# Patient Record
Sex: Female | Born: 1960
Health system: Southern US, Community
[De-identification: ages and names within clinical notes are randomized; demographics above are authoritative.]

## PROBLEM LIST (undated history)

## (undated) DIAGNOSIS — I82409 Acute embolism and thrombosis of unspecified deep veins of unspecified lower extremity: Secondary | ICD-10-CM

## (undated) DIAGNOSIS — B029 Zoster without complications: Secondary | ICD-10-CM

## (undated) DIAGNOSIS — R748 Abnormal levels of other serum enzymes: Secondary | ICD-10-CM

## (undated) DIAGNOSIS — F329 Major depressive disorder, single episode, unspecified: Secondary | ICD-10-CM

## (undated) DIAGNOSIS — I10 Essential (primary) hypertension: Secondary | ICD-10-CM

## (undated) HISTORY — PX: TONSILLECTOMY: SUR1361

## (undated) HISTORY — DX: Zoster without complications: B02.9

## (undated) HISTORY — DX: Essential (primary) hypertension: I10

## (undated) HISTORY — DX: Abnormal levels of other serum enzymes: R74.8

## (undated) HISTORY — PX: DILATION AND CURETTAGE OF UTERUS: SHX78

## (undated) HISTORY — DX: Major depressive disorder, single episode, unspecified: F32.9

## (undated) HISTORY — DX: Acute embolism and thrombosis of unspecified deep veins of unspecified lower extremity: I82.409

---

## 2012-09-18 ENCOUNTER — Ambulatory Visit: Payer: Self-pay

## 2013-04-25 LAB — HM COLONOSCOPY

## 2013-12-10 LAB — HM PAP SMEAR

## 2014-01-14 ENCOUNTER — Ambulatory Visit: Payer: Self-pay

## 2014-01-14 LAB — HM MAMMOGRAPHY

## 2014-12-11 DIAGNOSIS — F329 Major depressive disorder, single episode, unspecified: Secondary | ICD-10-CM

## 2014-12-11 DIAGNOSIS — F32A Depression, unspecified: Secondary | ICD-10-CM

## 2014-12-11 DIAGNOSIS — I10 Essential (primary) hypertension: Secondary | ICD-10-CM | POA: Insufficient documentation

## 2014-12-11 DIAGNOSIS — R748 Abnormal levels of other serum enzymes: Secondary | ICD-10-CM | POA: Insufficient documentation

## 2014-12-11 HISTORY — DX: Depression, unspecified: F32.A

## 2014-12-12 ENCOUNTER — Ambulatory Visit (INDEPENDENT_AMBULATORY_CARE_PROVIDER_SITE_OTHER): Payer: BLUE CROSS/BLUE SHIELD | Admitting: Unknown Physician Specialty

## 2014-12-12 ENCOUNTER — Encounter: Payer: Self-pay | Admitting: Unknown Physician Specialty

## 2014-12-12 VITALS — BP 132/75 | HR 78 | Temp 98.5°F | Ht 66.0 in | Wt 197.2 lb

## 2014-12-12 DIAGNOSIS — E669 Obesity, unspecified: Secondary | ICD-10-CM | POA: Diagnosis not present

## 2014-12-12 DIAGNOSIS — Z0000 Encounter for general adult medical examination without abnormal findings: Secondary | ICD-10-CM | POA: Diagnosis not present

## 2014-12-12 DIAGNOSIS — M7581 Other shoulder lesions, right shoulder: Secondary | ICD-10-CM | POA: Diagnosis not present

## 2014-12-12 NOTE — Progress Notes (Signed)
BP 132/75 mmHg  Pulse 78  Temp(Src) 98.5 F (36.9 C)  Ht 5\' 6"  (4.431 m)  Wt 197 lb 3.2 oz (89.449 kg)  BMI 31.84 kg/m2  SpO2 99%  LMP  (LMP Unknown)   Subjective:    Patient ID: Maria Ball, female    DOB: 06-Jun-1960, 54 y.o.   MRN: [id number]  HPI: Maria Ball is a 54 y.o. female  Chief Complaint  Patient presents with  . Annual Exam    Relevant past medical, surgical, family and social history reviewed and updated as indicated. Interim medical history since our last visit reviewed. Allergies and medications reviewed and updated.  Review of Systems  Constitutional: Negative.   HENT: Negative.   Eyes: Negative.   Respiratory: Negative.   Cardiovascular: Negative.   Gastrointestinal: Negative.   Endocrine: Negative.   Genitourinary: Negative.   Musculoskeletal:       Right upper arm hurts of reaching up or putting arm behind her.  Problem comes and goes for about 2-3 months.    Skin: Negative.   Allergic/Immunologic: Negative.   Neurological: Negative.   Hematological: Negative.   Psychiatric/Behavioral: Negative.       Objective:    BP 132/75 mmHg  Pulse 78  Temp(Src) 98.5 F (36.9 C)  Ht 5\' 6"  (1.676 m)  Wt 197 lb 3.2 oz (89.449 kg)  BMI 31.84 kg/m2  SpO2 99%  LMP  (LMP Unknown)  Wt Readings from Last 3 Encounters:  12/12/14 197 lb 3.2 oz (89.449 kg)  12/10/13 200 lb (90.719 kg)    Physical Exam  Constitutional: She is oriented to person, place, and time. She appears well-developed and well-nourished.  HENT:  Head: Normocephalic and atraumatic.  Eyes: Pupils are equal, round, and reactive to light. Right eye exhibits no discharge. Left eye exhibits no discharge. No scleral icterus.  Neck: Normal range of motion. Neck supple. Carotid bruit is not present. No thyromegaly present.  Cardiovascular: Normal rate, regular rhythm and normal heart sounds.  Exam reveals no gallop and no friction rub.   No murmur heard. Pulmonary/Chest:  Effort normal and breath sounds normal. No respiratory distress. She has no wheezes. She has no rales.  Abdominal: Soft. Bowel sounds are normal. There is no tenderness. There is no rebound.  Genitourinary: No breast swelling, tenderness or discharge.  Musculoskeletal: Normal range of motion.       Right shoulder: She exhibits no tenderness, no bony tenderness, no swelling, no effusion, no crepitus and no deformity.  Right shoulder positive empty can.  Positive impingment with cross arm, Obrien, and Neer test.    Lymphadenopathy:    She has no cervical adenopathy.  Neurological: She is alert and oriented to person, place, and time.  Skin: Skin is warm, dry and intact. No rash noted.  Psychiatric: She has a normal mood and affect. Her speech is normal and behavior is normal. Judgment and thought content normal. Cognition and memory are normal.      Assessment & Plan:   Problem List Items Addressed This Visit      Unprioritized   Obesity    Lost 15 pounds in the last year.  Working on diet and exercise.         Other Visit Diagnoses    Rotator cuff tendonitis, right    -  Primary    Refer to PT for treatment.      Relevant Orders    Ambulatory referral to Physical Therapy    Annual  physical exam        Relevant Orders    MM DIGITAL SCREENING BILATERAL    CBC with Differential/Platelet    Comprehensive metabolic panel    TSH    Lipid Panel w/o Chol/HDL Ratio    HIV antibody         Follow up plan: Return in about 1 year (around 12/12/2015), or if symptoms worsen or fail to improve.

## 2014-12-12 NOTE — Patient Instructions (Signed)
Think you're too busy to work out? We have the workout for you. In minutes, high-intensity interval training (H.I.I.T.) will have you sweating, breathing hard and maximizing the health benefits of exercise without the time commitment. Best of all, it's scientifically proven to work.  What Is H.I.I.T.? SHORT WORKOUTS 101 High-intensity interval training - referred to as H.I.I.T. - is based on the idea that short bursts of strenuous exercise can have a big impact on the body. If moderate exercise - like a 20-minute jog - is good for your heart, lungs and metabolism, H.I.I.T. packs the benefits of that workout and more into a few minutes. It may sound too good to be true, but learning this exercise technique and adapting it to your life can mean saving hours at the gym. If you think you don't have time to exercise, H.I.I.T. may be the workout for you.  You can try it with any aerobic activity you like. The principles of H.I.I.T. can be applied to running, biking, stair climbing, swimming, jumping rope, rowing, even hopping or skipping. (Yes, skipping!)  The downside? Even though H.I.I.T. lasts only minutes, the workouts are tough, requiring you to push your body near its limit.  HOW INTENSE IS HIGH INTENSITY? High-intensity exercise is obviously not a casual stroll down the street, but it's not a run-till-your-lungs-pop explosion, either. Think breathless, not winded. Heart-pounding, not exploding. Legs pumping, but not uncontrolled.  You don't need any fancy heart rate monitors to do these workouts. Use cues from your body as a guide. In the middle of a high-intensity workout you should be able to say single words, but not complete whole sentences. So, if you can keep chatting to your workout partner during this workout, pump it up a few notches.  02-25-29 Training This simple program will help you make the most of a short workout by improving heart health and endurance. Try it with your favorite  cardiovascular activity. The essentials of 02-25-29 training are simple. Run, ride or perhaps row on a rowing machine gently for 30 seconds, accelerate to a moderate pace for 20 seconds, then sprint as hard as you can for 10 seconds. (It should be called 30-20-10 training, obviously, but that is not as catchy.) Repeat.  You don't even need a stopwatch to monitor the 30-, 20-, and 10-second time changes. You can just count to yourself, which seems to make the intervals pass more quickly.  Best of all? The grueling, all-out portion of the workout lasts for only 10 seconds. C'mon, you can do anything for 10 seconds, right?  Got 10 Minutes? A solitary minute of hard work buried in 10 minutes of activity can make a big difference.  The 10-Minute Workout If you like to run, bike, row or swim - just a little bit - this workout is a great option for you. Step 1 Warm up for 2 minutes Step 2 Pedal, run or swim all-out for 20 seconds. Repeat 2 more times Warm down for 3 minutes    GET STARTED To benefit the most from really, really short workouts, you need to build the habit of doing them into your hectic life. Ideally, you'll complete the workout three times a week. The best way to build that habit is to start small and be willing to tweak your schedule where you can to accommodate your new workout.  First set up a spot in your house for your workout, equipped with whatever you need to get the job done: sneakers, a  chair, a towel, etc. Then slot your workout in before you would normally shower. (You can even do it in the bathroom.) Or wake up five minutes earlier and do it first thing in the morning, so you can head off to work feeling accomplished. Or do it during your lunch hour. Run up your office's stairs or grab a private conference room for just a few minutes. Or work it into your commute. If you walk or bike to work, add some heavy intervals on the way home.  GET A BOOST FROM MUSIC Creating a  workout playlist of high-energy tunes you love will not make your workout feel easier, but it may cause you to exercise harder without even realizing it. Best of all, if you are doing a really short workout, you need only one or two great tunes to get you through. If you are willing to try something a bit different, make your own music as you exercise. Sing, hum, clap your hands, whatever you can do to jam along to your playlist. It may give you an extra boost to finish strong.  Find a song or podcast that's the length of your really, really short workout. By the time the song is over, you're done.  Excerpted from the [employer] Well column http://www.nytimes.com/well/guides/really-really-short-workouts?smid=fb-nytwell&smtyp=pay  

## 2014-12-12 NOTE — Assessment & Plan Note (Signed)
Lost 15 pounds in the last year.  Working on diet and exercise.

## 2014-12-13 LAB — CBC WITH DIFFERENTIAL/PLATELET
BASOS: 1 %
Basophils Absolute: 0 10*3/uL (ref 0.0–0.2)
EOS (ABSOLUTE): 0 10*3/uL (ref 0.0–0.4)
Eos: 1 %
HEMOGLOBIN: 13.2 g/dL (ref 11.1–15.9)
Hematocrit: 39.5 % (ref 34.0–46.6)
IMMATURE GRANULOCYTES: 0 %
Immature Grans (Abs): 0 10*3/uL (ref 0.0–0.1)
LYMPHS ABS: 2 10*3/uL (ref 0.7–3.1)
Lymphs: 34 %
MCH: 27 pg (ref 26.6–33.0)
MCHC: 33.4 g/dL (ref 31.5–35.7)
MCV: 81 fL (ref 79–97)
Monocytes Absolute: 0.3 10*3/uL (ref 0.1–0.9)
Monocytes: 5 %
Neutrophils Absolute: 3.4 10*3/uL (ref 1.4–7.0)
Neutrophils: 59 %
Platelets: 344 10*3/uL (ref 150–379)
RBC: 4.89 x10E6/uL (ref 3.77–5.28)
RDW: 14.2 % (ref 12.3–15.4)
WBC: 5.8 10*3/uL (ref 3.4–10.8)

## 2014-12-13 LAB — LIPID PANEL W/O CHOL/HDL RATIO
CHOLESTEROL TOTAL: 195 mg/dL (ref 100–199)
HDL: 43 mg/dL (ref >39)
LDL Calculated: 128 mg/dL — ABNORMAL HIGH (ref 0–99)
TRIGLYCERIDES: 120 mg/dL (ref 0–149)
VLDL Cholesterol Cal: 24 mg/dL (ref 5–40)

## 2014-12-13 LAB — COMPREHENSIVE METABOLIC PANEL
ALK PHOS: 141 IU/L — AB (ref 39–117)
ALT: 20 IU/L (ref 0–32)
AST: 15 IU/L (ref 0–40)
Albumin/Globulin Ratio: 1.9 (ref 1.1–2.5)
Albumin: 4.2 g/dL (ref 3.5–5.5)
BILIRUBIN TOTAL: 0.2 mg/dL (ref 0.0–1.2)
BUN/Creatinine Ratio: 16 (ref 9–23)
BUN: 9 mg/dL (ref 6–24)
CO2: 22 mmol/L (ref 18–29)
Calcium: 9.1 mg/dL (ref 8.7–10.2)
Chloride: 105 mmol/L (ref 97–108)
Creatinine, Ser: 0.58 mg/dL (ref 0.57–1.00)
GFR calc Af Amer: 121 mL/min/{1.73_m2} (ref >59)
GFR calc non Af Amer: 105 mL/min/{1.73_m2} (ref >59)
Globulin, Total: 2.2 g/dL (ref 1.5–4.5)
Glucose: 93 mg/dL (ref 65–99)
Potassium: 4.2 mmol/L (ref 3.5–5.2)
Sodium: 144 mmol/L (ref 134–144)
TOTAL PROTEIN: 6.4 g/dL (ref 6.0–8.5)

## 2014-12-13 LAB — TSH: TSH: 1.1 u[IU]/mL (ref 0.450–4.500)

## 2014-12-13 LAB — HIV ANTIBODY (ROUTINE TESTING W REFLEX): HIV SCREEN 4TH GENERATION: NONREACTIVE

## 2014-12-15 ENCOUNTER — Encounter: Payer: Self-pay | Admitting: Unknown Physician Specialty

## 2015-01-16 ENCOUNTER — Ambulatory Visit
Admission: RE | Admit: 2015-01-16 | Discharge: 2015-01-16 | Disposition: A | Discharge status: Home / Self Care | Payer: BLUE CROSS/BLUE SHIELD | Source: Ambulatory Visit | Attending: Unknown Physician Specialty | Admitting: Unknown Physician Specialty

## 2015-01-16 DIAGNOSIS — Z1231 Encounter for screening mammogram for malignant neoplasm of breast: Secondary | ICD-10-CM | POA: Diagnosis not present

## 2015-01-16 DIAGNOSIS — Z0000 Encounter for general adult medical examination without abnormal findings: Secondary | ICD-10-CM

## 2015-12-15 ENCOUNTER — Encounter: Payer: BLUE CROSS/BLUE SHIELD | Admitting: Unknown Physician Specialty

## 2016-02-29 ENCOUNTER — Ambulatory Visit (INDEPENDENT_AMBULATORY_CARE_PROVIDER_SITE_OTHER): Payer: BLUE CROSS/BLUE SHIELD | Admitting: Unknown Physician Specialty

## 2016-02-29 ENCOUNTER — Encounter: Payer: Self-pay | Admitting: Unknown Physician Specialty

## 2016-02-29 VITALS — BP 130/77 | HR 89 | Temp 98.3°F | Ht 66.0 in | Wt 203.2 lb

## 2016-02-29 DIAGNOSIS — Z0000 Encounter for general adult medical examination without abnormal findings: Secondary | ICD-10-CM

## 2016-02-29 DIAGNOSIS — Z23 Encounter for immunization: Secondary | ICD-10-CM | POA: Diagnosis not present

## 2016-02-29 DIAGNOSIS — R748 Abnormal levels of other serum enzymes: Secondary | ICD-10-CM | POA: Diagnosis not present

## 2016-02-29 NOTE — Progress Notes (Signed)
BP 130/77 (BP Location: Left Arm, Cuff Size: Large)   Pulse 89   Temp 98.3 F (36.8 C)   Ht 5\' 6"  (1.676 m)   Wt 203 lb 3.2 oz (92.2 kg)   LMP  (LMP Unknown)   SpO2 98%   BMI 32.80 kg/m    Subjective:    Patient ID: Maria Ball, female    DOB: 05-Mar-1961, 55 y.o.   MRN: [id number]  HPI: Maria Ball is a 54 y.o. female  Chief Complaint  Patient presents with  . Annual Exam    Hep C order entered     Relevant past medical, surgical, family and social history reviewed and updated as indicated. Interim medical history since our last visit reviewed. Allergies and medications reviewed and updated.  Review of Systems  Constitutional: Negative.   HENT: Negative.   Eyes: Negative.   Respiratory: Negative.   Cardiovascular: Negative.   Gastrointestinal: Negative.   Endocrine: Negative.   Genitourinary: Negative.   Musculoskeletal: Negative.   Skin: Negative.   Allergic/Immunologic: Negative.   Neurological: Negative.   Hematological: Negative.   Psychiatric/Behavioral: Negative.     Per HPI unless specifically indicated above     Objective:    BP 130/77 (BP Location: Left Arm, Cuff Size: Large)   Pulse 89   Temp 98.3 F (36.8 C)   Ht 5\' 6"  (1.676 m)   Wt 203 lb 3.2 oz (92.2 kg)   LMP  (LMP Unknown)   SpO2 98%   BMI 32.80 kg/m   Wt Readings from Last 3 Encounters:  02/29/16 203 lb 3.2 oz (92.2 kg)  12/12/14 197 lb 3.2 oz (89.4 kg)  12/10/13 200 lb (90.7 kg)    Physical Exam  Constitutional: She is oriented to person, place, and time. She appears well-developed and well-nourished.  HENT:  Head: Normocephalic and atraumatic.  Eyes: Pupils are equal, round, and reactive to light. Right eye exhibits no discharge. Left eye exhibits no discharge. No scleral icterus.  Neck: Normal range of motion. Neck supple. Carotid bruit is not present. No thyromegaly present.  Cardiovascular: Normal rate, regular rhythm and normal heart sounds.  Exam reveals  no gallop and no friction rub.   No murmur heard. Pulmonary/Chest: Effort normal and breath sounds normal. No respiratory distress. She has no wheezes. She has no rales.  Abdominal: Soft. Bowel sounds are normal. There is no tenderness. There is no rebound.  Genitourinary: No breast swelling, tenderness or discharge.  Musculoskeletal: Normal range of motion.  Lymphadenopathy:    She has no cervical adenopathy.  Neurological: She is alert and oriented to person, place, and time.  Skin: Skin is warm, dry and intact. No rash noted.  Psychiatric: She has a normal mood and affect. Her speech is normal and behavior is normal. Judgment and thought content normal. Cognition and memory are normal.    Results for orders placed or performed in visit on 12/12/14  CBC with Differential/Platelet  Result Value Ref Range   WBC 5.8 3.4 - 10.8 x10E3/uL   RBC 4.89 3.77 - 5.28 x10E6/uL   Hemoglobin 13.2 11.1 - 15.9 g/dL   Hematocrit 39.5 34.0 - 46.6 %   MCV 81 79 - 97 fL   MCH 27.0 26.6 - 33.0 pg   MCHC 33.4 31.5 - 35.7 g/dL   RDW 14.2 12.3 - 15.4 %   Platelets 344 150 - 379 x10E3/uL   Neutrophils 59 %   Lymphs 34 %   Monocytes 5 %  Eos 1 %   Basos 1 %   Neutrophils Absolute 3.4 1.4 - 7.0 x10E3/uL   Lymphocytes Absolute 2.0 0.7 - 3.1 x10E3/uL   Monocytes Absolute 0.3 0.1 - 0.9 x10E3/uL   EOS (ABSOLUTE) 0.0 0.0 - 0.4 x10E3/uL   Basophils Absolute 0.0 0.0 - 0.2 x10E3/uL   Immature Granulocytes 0 %   Immature Grans (Abs) 0.0 0.0 - 0.1 x10E3/uL  Comprehensive metabolic panel  Result Value Ref Range   Glucose 93 65 - 99 mg/dL   BUN 9 6 - 24 mg/dL   Creatinine, Ser 0.58 0.57 - 1.00 mg/dL   GFR calc non Af Amer 105 >59 mL/min/1.73   GFR calc Af Amer 121 >59 mL/min/1.73   BUN/Creatinine Ratio 16 9 - 23   Sodium 144 134 - 144 mmol/L   Potassium 4.2 3.5 - 5.2 mmol/L   Chloride 105 97 - 108 mmol/L   CO2 22 18 - 29 mmol/L   Calcium 9.1 8.7 - 10.2 mg/dL   Total Protein 6.4 6.0 - 8.5 g/dL   Albumin  4.2 3.5 - 5.5 g/dL   Globulin, Total 2.2 1.5 - 4.5 g/dL   Albumin/Globulin Ratio 1.9 1.1 - 2.5   Bilirubin Total 0.2 0.0 - 1.2 mg/dL   Alkaline Phosphatase 141 (H) 39 - 117 IU/L   AST 15 0 - 40 IU/L   ALT 20 0 - 32 IU/L  TSH  Result Value Ref Range   TSH 1.100 0.450 - 4.500 uIU/mL  Lipid Panel w/o Chol/HDL Ratio  Result Value Ref Range   Cholesterol, Total 195 100 - 199 mg/dL   Triglycerides 120 0 - 149 mg/dL   HDL 43 >39 mg/dL   VLDL Cholesterol Cal 24 5 - 40 mg/dL   LDL Calculated 128 (H) 0 - 99 mg/dL  HIV antibody  Result Value Ref Range   HIV Screen 4th Generation wRfx Non Reactive Non Reactive      Assessment & Plan:   Problem List Items Addressed This Visit      Unprioritized   Alkaline phosphatase raised   Relevant Orders   Comprehensive metabolic panel    Other Visit Diagnoses    Health care maintenance    -  Primary   Need for influenza vaccination       Relevant Orders   Flu Vaccine QUAD 36+ mos IM (Completed)   Annual physical exam       Relevant Orders   CBC with Differential/Platelet   Comprehensive metabolic panel   Lipid Panel w/o Chol/HDL Ratio   TSH   MM DIGITAL SCREENING BILATERAL   Hepatitis C antibody       Follow up plan: Return for f/u mole removal.

## 2016-02-29 NOTE — Patient Instructions (Addendum)

## 2016-03-01 ENCOUNTER — Encounter: Payer: Self-pay | Admitting: Unknown Physician Specialty

## 2016-03-01 ENCOUNTER — Ambulatory Visit (INDEPENDENT_AMBULATORY_CARE_PROVIDER_SITE_OTHER): Payer: BLUE CROSS/BLUE SHIELD | Admitting: Unknown Physician Specialty

## 2016-03-01 VITALS — BP 138/72 | HR 101 | Temp 98.9°F | Ht 66.0 in | Wt 202.0 lb

## 2016-03-01 DIAGNOSIS — H6121 Impacted cerumen, right ear: Secondary | ICD-10-CM | POA: Diagnosis not present

## 2016-03-01 DIAGNOSIS — L821 Other seborrheic keratosis: Secondary | ICD-10-CM

## 2016-03-01 LAB — CBC WITH DIFFERENTIAL/PLATELET
BASOS ABS: 0 10*3/uL (ref 0.0–0.2)
BASOS: 0 %
EOS (ABSOLUTE): 0 10*3/uL (ref 0.0–0.4)
Eos: 1 %
Hematocrit: 40 % (ref 34.0–46.6)
Hemoglobin: 13.2 g/dL (ref 11.1–15.9)
IMMATURE GRANS (ABS): 0 10*3/uL (ref 0.0–0.1)
IMMATURE GRANULOCYTES: 0 %
LYMPHS: 39 %
Lymphocytes Absolute: 3 10*3/uL (ref 0.7–3.1)
MCH: 27.5 pg (ref 26.6–33.0)
MCHC: 33 g/dL (ref 31.5–35.7)
MCV: 83 fL (ref 79–97)
MONOS ABS: 0.4 10*3/uL (ref 0.1–0.9)
Monocytes: 6 %
NEUTROS PCT: 54 %
Neutrophils Absolute: 4.2 10*3/uL (ref 1.4–7.0)
PLATELETS: 375 10*3/uL (ref 150–379)
RBC: 4.8 x10E6/uL (ref 3.77–5.28)
RDW: 14 % (ref 12.3–15.4)
WBC: 7.7 10*3/uL (ref 3.4–10.8)

## 2016-03-01 LAB — LIPID PANEL W/O CHOL/HDL RATIO
CHOLESTEROL TOTAL: 195 mg/dL (ref 100–199)
HDL: 43 mg/dL (ref >39)
LDL Calculated: 115 mg/dL — ABNORMAL HIGH (ref 0–99)
TRIGLYCERIDES: 183 mg/dL — AB (ref 0–149)
VLDL CHOLESTEROL CAL: 37 mg/dL (ref 5–40)

## 2016-03-01 LAB — COMPREHENSIVE METABOLIC PANEL
A/G RATIO: 1.7 (ref 1.2–2.2)
ALT: 26 IU/L (ref 0–32)
AST: 18 IU/L (ref 0–40)
Albumin: 4.2 g/dL (ref 3.5–5.5)
Alkaline Phosphatase: 161 IU/L — ABNORMAL HIGH (ref 39–117)
BUN/Creatinine Ratio: 11 (ref 9–23)
BUN: 8 mg/dL (ref 6–24)
CHLORIDE: 104 mmol/L (ref 96–106)
CO2: 24 mmol/L (ref 18–29)
Calcium: 9.3 mg/dL (ref 8.7–10.2)
Creatinine, Ser: 0.76 mg/dL (ref 0.57–1.00)
GFR calc Af Amer: 101 mL/min/{1.73_m2} (ref >59)
GFR calc non Af Amer: 88 mL/min/{1.73_m2} (ref >59)
GLUCOSE: 95 mg/dL (ref 65–99)
Globulin, Total: 2.5 g/dL (ref 1.5–4.5)
POTASSIUM: 4.3 mmol/L (ref 3.5–5.2)
Sodium: 142 mmol/L (ref 134–144)
TOTAL PROTEIN: 6.7 g/dL (ref 6.0–8.5)

## 2016-03-01 LAB — TSH: TSH: 1.03 u[IU]/mL (ref 0.450–4.500)

## 2016-03-01 LAB — HEPATITIS C ANTIBODY: Hep C Virus Ab: <0.1 s/co ratio (ref 0.0–0.9)

## 2016-03-01 NOTE — Progress Notes (Signed)
BP 138/72 (BP Location: Left Arm, Patient Position: Sitting, Cuff Size: Large)   Pulse (!) 101   Temp 98.9 F (37.2 C)   Ht 5\' 6"  (1.676 m)   Wt 202 lb (91.6 kg)   LMP  (LMP Unknown)   SpO2 97%   BMI 32.60 kg/m    Subjective:    Patient ID: Maria Ball, female    DOB: [birth year], 55 y.o.   MRN: [id number]  HPI: Maria Ball is a 55 y.o. female  Chief Complaint  Patient presents with  . Mole Removal   Pt is here to f/u with a irregular mole on neck with ABCD changes.    Having fullness right ear.    Relevant past medical, surgical, family and social history reviewed and updated as indicated. Interim medical history since our last visit reviewed. Allergies and medications reviewed and updated.  Review of Systems  Per HPI unless specifically indicated above     Objective:    BP 138/72 (BP Location: Left Arm, Patient Position: Sitting, Cuff Size: Large)   Pulse (!) 101   Temp 98.9 F (37.2 C)   Ht 5\' 6"  (1.676 m)   Wt 202 lb (91.6 kg)   LMP  (LMP Unknown)   SpO2 97%   BMI 32.60 kg/m   Wt Readings from Last 3 Encounters:  03/01/16 202 lb (91.6 kg)  02/29/16 203 lb 3.2 oz (92.2 kg)  12/12/14 197 lb 3.2 oz (89.4 kg)    Physical Exam  Constitutional: She is oriented to person, place, and time. She appears well-developed and well-nourished. No distress.  HENT:  Head: Normocephalic and atraumatic.  Cerumen right ear.  Ear irrigated  Eyes: Conjunctivae and lids are normal. Right eye exhibits no discharge. Left eye exhibits no discharge. No scleral icterus.  Cardiovascular: Normal rate.   Pulmonary/Chest: Effort normal.  Abdominal: Normal appearance. There is no splenomegaly or hepatomegaly.  Musculoskeletal: Normal range of motion.  Neurological: She is alert and oriented to person, place, and time.  Skin: Skin is intact. No rash noted. No pallor.  Irregular lesion on neck suspect seborrheic keratosis.  Area cryoed  Psychiatric: She has a normal  mood and affect. Her behavior is normal. Judgment and thought content normal.    Results for orders placed or performed in visit on 02/29/16  CBC with Differential/Platelet  Result Value Ref Range   WBC 7.7 3.4 - 10.8 x10E3/uL   RBC 4.80 3.77 - 5.28 x10E6/uL   Hemoglobin 13.2 11.1 - 15.9 g/dL   Hematocrit 40.0 34.0 - 46.6 %   MCV 83 79 - 97 fL   MCH 27.5 26.6 - 33.0 pg   MCHC 33.0 31.5 - 35.7 g/dL   RDW 14.0 12.3 - 15.4 %   Platelets 375 150 - 379 x10E3/uL   Neutrophils 54 Not Estab. %   Lymphs 39 Not Estab. %   Monocytes 6 Not Estab. %   Eos 1 Not Estab. %   Basos 0 Not Estab. %   Neutrophils Absolute 4.2 1.4 - 7.0 x10E3/uL   Lymphocytes Absolute 3.0 0.7 - 3.1 x10E3/uL   Monocytes Absolute 0.4 0.1 - 0.9 x10E3/uL   EOS (ABSOLUTE) 0.0 0.0 - 0.4 x10E3/uL   Basophils Absolute 0.0 0.0 - 0.2 x10E3/uL   Immature Granulocytes 0 Not Estab. %   Immature Grans (Abs) 0.0 0.0 - 0.1 x10E3/uL  Comprehensive metabolic panel  Result Value Ref Range   Glucose 95 65 - 99 mg/dL   BUN 8  6 - 24 mg/dL   Creatinine, Ser 0.76 0.57 - 1.00 mg/dL   GFR calc non Af Amer 88 >59 mL/min/1.73   GFR calc Af Amer 101 >59 mL/min/1.73   BUN/Creatinine Ratio 11 9 - 23   Sodium 142 134 - 144 mmol/L   Potassium 4.3 3.5 - 5.2 mmol/L   Chloride 104 96 - 106 mmol/L   CO2 24 18 - 29 mmol/L   Calcium 9.3 8.7 - 10.2 mg/dL   Total Protein 6.7 6.0 - 8.5 g/dL   Albumin 4.2 3.5 - 5.5 g/dL   Globulin, Total 2.5 1.5 - 4.5 g/dL   Albumin/Globulin Ratio 1.7 1.2 - 2.2   Bilirubin Total <0.2 0.0 - 1.2 mg/dL   Alkaline Phosphatase 161 (H) 39 - 117 IU/L   AST 18 0 - 40 IU/L   ALT 26 0 - 32 IU/L  Lipid Panel w/o Chol/HDL Ratio  Result Value Ref Range   Cholesterol, Total 195 100 - 199 mg/dL   Triglycerides 183 (H) 0 - 149 mg/dL   HDL 43 >39 mg/dL   VLDL Cholesterol Cal 37 5 - 40 mg/dL   LDL Calculated 115 (H) 0 - 99 mg/dL  TSH  Result Value Ref Range   TSH 1.030 0.450 - 4.500 uIU/mL  Hepatitis C antibody  Result  Value Ref Range   Hep C Virus Ab <0.1 0.0 - 0.9 s/co ratio      Assessment & Plan:   Problem List Items Addressed This Visit    None    Visit Diagnoses    Cerumen debris on tympanic membrane of right ear    -  Primary   Seborrheic keratoses           Follow up plan: Return if symptoms worsen or fail to improve.

## 2016-04-06 ENCOUNTER — Ambulatory Visit
Admission: RE | Admit: 2016-04-06 | Discharge: 2016-04-06 | Disposition: A | Discharge status: Home / Self Care | Payer: BLUE CROSS/BLUE SHIELD | Source: Ambulatory Visit | Attending: Unknown Physician Specialty | Admitting: Unknown Physician Specialty

## 2016-04-06 DIAGNOSIS — Z1231 Encounter for screening mammogram for malignant neoplasm of breast: Secondary | ICD-10-CM | POA: Diagnosis not present

## 2016-04-06 DIAGNOSIS — Z0000 Encounter for general adult medical examination without abnormal findings: Secondary | ICD-10-CM

## 2016-07-17 DIAGNOSIS — H66003 Acute suppurative otitis media without spontaneous rupture of ear drum, bilateral: Secondary | ICD-10-CM | POA: Diagnosis not present

## 2017-03-21 ENCOUNTER — Encounter: Payer: Self-pay | Admitting: Unknown Physician Specialty

## 2017-03-21 ENCOUNTER — Ambulatory Visit (INDEPENDENT_AMBULATORY_CARE_PROVIDER_SITE_OTHER): Payer: BLUE CROSS/BLUE SHIELD | Admitting: Unknown Physician Specialty

## 2017-03-21 DIAGNOSIS — Z0000 Encounter for general adult medical examination without abnormal findings: Secondary | ICD-10-CM | POA: Diagnosis not present

## 2017-03-21 DIAGNOSIS — Z23 Encounter for immunization: Secondary | ICD-10-CM

## 2017-03-21 NOTE — Progress Notes (Signed)
BP 130/79 (BP Location: Left Arm, Cuff Size: Normal)   Pulse 86   Temp 98.6 F (37 C) (Oral)   Ht 5' 5.6" (1.666 m)   Wt 203 lb 9.6 oz (92.4 kg)   LMP  (LMP Unknown)   SpO2 97%   BMI 33.26 kg/m    Subjective:    Patient ID: Maria Ball, female    DOB: 01-04-1961, 56 y.o.   MRN: [id number]  HPI: Maria Ball is a 56 y.o. female  Chief Complaint  Patient presents with  . Annual Exam   Relevant past medical, surgical, family and social history reviewed and updated as indicated. Interim medical history since our last visit reviewed. Allergies and medications reviewed and updated.  Review of Systems  Constitutional: Negative.   HENT: Negative.   Eyes: Negative.   Respiratory: Negative.   Cardiovascular: Negative.   Gastrointestinal:       More "gassey" lately and sometimes a gas bubble in chest.    Endocrine: Negative.   Genitourinary: Negative.   Musculoskeletal: Negative.   Allergic/Immunologic: Negative.   Neurological: Negative.   Hematological: Negative.   Psychiatric/Behavioral: Negative.     Per HPI unless specifically indicated above     Objective:    BP 130/79 (BP Location: Left Arm, Cuff Size: Normal)   Pulse 86   Temp 98.6 F (37 C) (Oral)   Ht 5' 5.6" (1.666 m)   Wt 203 lb 9.6 oz (92.4 kg)   LMP  (LMP Unknown)   SpO2 97%   BMI 33.26 kg/m   Wt Readings from Last 3 Encounters:  03/21/17 203 lb 9.6 oz (92.4 kg)  03/01/16 202 lb (91.6 kg)  02/29/16 203 lb 3.2 oz (92.2 kg)    Physical Exam  Constitutional: She is oriented to person, place, and time. She appears well-developed and well-nourished.  HENT:  Head: Normocephalic and atraumatic.  Eyes: Pupils are equal, round, and reactive to light. Right eye exhibits no discharge. Left eye exhibits no discharge. No scleral icterus.  Neck: Normal range of motion. Neck supple. Carotid bruit is not present. No thyromegaly present.  Cardiovascular: Normal rate, regular rhythm and normal  heart sounds. Exam reveals no gallop and no friction rub.  No murmur heard. Pulmonary/Chest: Effort normal and breath sounds normal. No respiratory distress. She has no wheezes. She has no rales.  Abdominal: Soft. Bowel sounds are normal. There is no tenderness. There is no rebound.  Genitourinary: No breast swelling, tenderness or discharge.  Musculoskeletal: Normal range of motion.  Lymphadenopathy:    She has no cervical adenopathy.  Neurological: She is alert and oriented to person, place, and time.  Skin: Skin is warm, dry and intact. No rash noted.  Psychiatric: She has a normal mood and affect. Her speech is normal and behavior is normal. Judgment and thought content normal. Cognition and memory are normal.    Assessment & Plan:   Problem List Items Addressed This Visit    None    Visit Diagnoses    Need for influenza vaccination       Relevant Orders   Flu Vaccine QUAD 36+ mos IM (Completed)   Annual physical exam       Relevant Orders   CBC with Differential/Platelet   Comprehensive metabolic panel   Lipid Panel w/o Chol/HDL Ratio   TSH   MM DIGITAL SCREENING BILATERAL      Health maintenance; Colonoscopy due 2019 Td due 2024 Pap smear due 2010 Schedule mammogram HIV,  Hep C, influenza completed.     Follow up plan: Return in about 1 year (around 03/21/2018).

## 2017-03-21 NOTE — Patient Instructions (Addendum)
Influenza (Flu) Vaccine (Inactivated or Recombinant): What You Need to Know 1. Why get vaccinated? Influenza ("flu") is a contagious disease that spreads around the [place] every year, usually between October and May. Flu is caused by influenza viruses, and is spread mainly by coughing, sneezing, and close contact. Anyone can get flu. Flu strikes suddenly and can last several days. Symptoms vary by age, but can include:  fever/chills  sore throat  muscle aches  fatigue  cough  headache  runny or stuffy nose  Flu can also lead to pneumonia and blood infections, and cause diarrhea and seizures in children. If you have a medical condition, such as heart or lung disease, flu can make it worse. Flu is more dangerous for some people. Infants and young children, people 23 years of age and older, pregnant women, and people with certain health conditions or a weakened immune system are at greatest risk. Each year thousands of people in the [place] die from flu, and many more are hospitalized. Flu vaccine can:  keep you from getting flu,  make flu less severe if you do get it, and  keep you from spreading flu to your family and other people. 2. Inactivated and recombinant flu vaccines A dose of flu vaccine is recommended every flu season. Children 6 months through 91 years of age may need two doses during the same flu season. Everyone else needs only one dose each flu season. Some inactivated flu vaccines contain a very small amount of a mercury-based preservative called thimerosal. Studies have not shown thimerosal in vaccines to be harmful, but flu vaccines that do not contain thimerosal are available. There is no live flu virus in flu shots. They cannot cause the flu. There are many flu viruses, and they are always changing. Each year a new flu vaccine is made to protect against three or four viruses that are likely to cause disease in the upcoming flu season. But even when the  vaccine doesn't exactly match these viruses, it may still provide some protection. Flu vaccine cannot prevent:  flu that is caused by a virus not covered by the vaccine, or  illnesses that look like flu but are not.  It takes about 2 weeks for protection to develop after vaccination, and protection lasts through the flu season. 3. Some people should not get this vaccine Tell the person who is giving you the vaccine:  If you have any severe, life-threatening allergies. If you ever had a life-threatening allergic reaction after a dose of flu vaccine, or have a severe allergy to any part of this vaccine, you may be advised not to get vaccinated. Most, but not all, types of flu vaccine contain a small amount of egg protein.  If you ever had Guillain-Barr Syndrome (also called GBS). Some people with a history of GBS should not get this vaccine. This should be discussed with your doctor.  If you are not feeling well. It is usually okay to get flu vaccine when you have a mild illness, but you might be asked to come back when you feel better.  4. Risks of a vaccine reaction With any medicine, including vaccines, there is a chance of reactions. These are usually mild and go away on their own, but serious reactions are also possible. Most people who get a flu shot do not have any problems with it. Minor problems following a flu shot include:  soreness, redness, or swelling where the shot was given  hoarseness  sore,  red or itchy eyes  cough  fever  aches  headache  itching  fatigue  If these problems occur, they usually begin soon after the shot and last 1 or 2 days. More serious problems following a flu shot can include the following:  There may be a small increased risk of Guillain-Barre Syndrome (GBS) after inactivated flu vaccine. This risk has been estimated at 1 or 2 additional cases per million people vaccinated. This is much lower than the risk of severe complications from  flu, which can be prevented by flu vaccine.  Young children who get the flu shot along with pneumococcal vaccine (PCV13) and/or DTaP vaccine at the same time might be slightly more likely to have a seizure caused by fever. Ask your doctor for more information. Tell your doctor if a child who is getting flu vaccine has ever had a seizure.  Problems that could happen after any injected vaccine:  People sometimes faint after a medical procedure, including vaccination. Sitting or lying down for about 15 minutes can help prevent fainting, and injuries caused by a fall. Tell your doctor if you feel dizzy, or have vision changes or ringing in the ears.  Some people get severe pain in the shoulder and have difficulty moving the arm where a shot was given. This happens very rarely.  Any medication can cause a severe allergic reaction. Such reactions from a vaccine are very rare, estimated at about 1 in a million doses, and would happen within a few minutes to a few hours after the vaccination. As with any medicine, there is a very remote chance of a vaccine causing a serious injury or death. The safety of vaccines is always being monitored. For more information, visit: http://www.aguilar.org/ 5. What if there is a serious reaction? What should I look for? Look for anything that concerns you, such as signs of a severe allergic reaction, very high fever, or unusual behavior. Signs of a severe allergic reaction can include hives, swelling of the face and throat, difficulty breathing, a fast heartbeat, dizziness, and weakness. These would start a few minutes to a few hours after the vaccination. What should I do?  If you think it is a severe allergic reaction or other emergency that can't wait, call 9-1-1 and get the person to the nearest hospital. Otherwise, call your doctor.  Reactions should be reported to the Vaccine Adverse Event Reporting System (VAERS). Your doctor should file this report, or you  can do it yourself through the VAERS web site at www.vaers.SamedayNews.es, or by calling 6094730752. ? VAERS does not give medical advice. 6. The National Vaccine Injury Compensation Program The [employer] Vaccine Injury Compensation Program (VICP) is a federal program that was created to compensate people who may have been injured by certain vaccines. Persons who believe they may have been injured by a vaccine can learn about the program and about filing a claim by calling 458-267-6070 or visiting the [employer] website at GoldCloset.com.ee. There is a time limit to file a claim for compensation. 7. How can I learn more?  Ask your healthcare provider. He or she can give you the vaccine package insert or suggest other sources of information.  Call your local or state health department.  Contact the Centers for Disease Control and Prevention (CDC): ? Call (540)164-9661 (1-800-CDC-INFO) or ? Visit CDC's website at https://gibson.com/ Vaccine Information Statement, Inactivated Influenza Vaccine (12/13/2013) This information is not intended to replace advice given to you by your health care provider. Make sure  you discuss any questions you have with your health care provider.  Preventive Care 40-64 Years, Female Preventive care refers to lifestyle choices and visits with your health care provider that can promote health and wellness. What does preventive care include?  A yearly physical exam. This is also called an annual well check.  Dental exams once or twice a year.  Routine eye exams. Ask your health care provider how often you should have your eyes checked.  Personal lifestyle choices, including: ? Daily care of your teeth and gums. ? Regular physical activity. ? Eating a healthy diet. ? Avoiding tobacco and drug use. ? Limiting alcohol use. ? Practicing safe sex. ? Taking low-dose aspirin daily starting at age 54. ? Taking vitamin and mineral supplements as recommended by your  health care provider. What happens during an annual well check? The services and screenings done by your health care provider during your annual well check will depend on your age, overall health, lifestyle risk factors, and family history of disease. Counseling Your health care provider may ask you questions about your:  Alcohol use.  Tobacco use.  Drug use.  Emotional well-being.  Home and relationship well-being.  Sexual activity.  Eating habits.  Work and work Statistician.  Method of birth control.  Menstrual cycle.  Pregnancy history.  Screening You may have the following tests or measurements:  Height, weight, and BMI.  Blood pressure.  Lipid and cholesterol levels. These may be checked every 5 years, or more frequently if you are over 65 years old.  Skin check.  Lung cancer screening. You may have this screening every year starting at age 67 if you have a 30-pack-year history of smoking and currently smoke or have quit within the past 15 years.  Fecal occult blood test (FOBT) of the stool. You may have this test every year starting at age 35.  Flexible sigmoidoscopy or colonoscopy. You may have a sigmoidoscopy every 5 years or a colonoscopy every 10 years starting at age 2.  Hepatitis C blood test.  Hepatitis B blood test.  Sexually transmitted disease (STD) testing.  Diabetes screening. This is done by checking your blood sugar (glucose) after you have not eaten for a while (fasting). You may have this done every 1-3 years.  Mammogram. This may be done every 1-2 years. Talk to your health care provider about when you should start having regular mammograms. This may depend on whether you have a family history of breast cancer.  BRCA-related cancer screening. This may be done if you have a family history of breast, ovarian, tubal, or peritoneal cancers.  Pelvic exam and Pap test. This may be done every 3 years starting at age 16. Starting at age 28,  this may be done every 5 years if you have a Pap test in combination with an HPV test.  Bone density scan. This is done to screen for osteoporosis. You may have this scan if you are at high risk for osteoporosis.  Discuss your test results, treatment options, and if necessary, the need for more tests with your health care provider. Vaccines Your health care provider may recommend certain vaccines, such as:  Influenza vaccine. This is recommended every year.  Tetanus, diphtheria, and acellular pertussis (Tdap, Td) vaccine. You may need a Td booster every 10 years.  Varicella vaccine. You may need this if you have not been vaccinated.  Zoster vaccine. You may need this after age 43.  Measles, mumps, and rubella (MMR) vaccine. You  may need at least one dose of MMR if you were born in 1957 or later. You may also need a second dose.  Pneumococcal 13-valent conjugate (PCV13) vaccine. You may need this if you have certain conditions and were not previously vaccinated.  Pneumococcal polysaccharide (PPSV23) vaccine. You may need one or two doses if you smoke cigarettes or if you have certain conditions.  Meningococcal vaccine. You may need this if you have certain conditions.  Hepatitis A vaccine. You may need this if you have certain conditions or if you travel or work in places where you may be exposed to hepatitis A.  Hepatitis B vaccine. You may need this if you have certain conditions or if you travel or work in places where you may be exposed to hepatitis B.  Haemophilus influenzae type b (Hib) vaccine. You may need this if you have certain conditions.  Talk to your health care provider about which screenings and vaccines you need and how often you need them. This information is not intended to replace advice given to you by your health care provider. Make sure you discuss any questions you have with your health care  provider. ------------------------------------------------------------------ Please do call to schedule your mammogram; the number to schedule one at either [facility] or [facility] is [phone]

## 2017-03-22 ENCOUNTER — Encounter: Payer: Self-pay | Admitting: Unknown Physician Specialty

## 2017-03-22 LAB — COMPREHENSIVE METABOLIC PANEL
A/G RATIO: 2.1 (ref 1.2–2.2)
ALT: 24 IU/L (ref 0–32)
AST: 20 IU/L (ref 0–40)
Albumin: 4.5 g/dL (ref 3.5–5.5)
Alkaline Phosphatase: 161 IU/L — ABNORMAL HIGH (ref 39–117)
BUN/Creatinine Ratio: 15 (ref 9–23)
BUN: 11 mg/dL (ref 6–24)
CHLORIDE: 103 mmol/L (ref 96–106)
CO2: 24 mmol/L (ref 20–29)
Calcium: 9.2 mg/dL (ref 8.7–10.2)
Creatinine, Ser: 0.72 mg/dL (ref 0.57–1.00)
GFR calc non Af Amer: 93 mL/min/{1.73_m2} (ref >59)
GFR, EST AFRICAN AMERICAN: 108 mL/min/{1.73_m2} (ref >59)
GLOBULIN, TOTAL: 2.1 g/dL (ref 1.5–4.5)
Glucose: 90 mg/dL (ref 65–99)
POTASSIUM: 4.5 mmol/L (ref 3.5–5.2)
SODIUM: 139 mmol/L (ref 134–144)
Total Protein: 6.6 g/dL (ref 6.0–8.5)

## 2017-03-22 LAB — TSH: TSH: 1.35 u[IU]/mL (ref 0.450–4.500)

## 2017-03-22 LAB — CBC WITH DIFFERENTIAL/PLATELET
BASOS: 0 %
Basophils Absolute: 0 10*3/uL (ref 0.0–0.2)
EOS (ABSOLUTE): 0.1 10*3/uL (ref 0.0–0.4)
Eos: 1 %
Hematocrit: 39 % (ref 34.0–46.6)
Hemoglobin: 12.9 g/dL (ref 11.1–15.9)
Immature Grans (Abs): 0 10*3/uL (ref 0.0–0.1)
Immature Granulocytes: 0 %
LYMPHS ABS: 2.6 10*3/uL (ref 0.7–3.1)
Lymphs: 34 %
MCH: 27.1 pg (ref 26.6–33.0)
MCHC: 33.1 g/dL (ref 31.5–35.7)
MCV: 82 fL (ref 79–97)
MONOS ABS: 0.5 10*3/uL (ref 0.1–0.9)
Monocytes: 6 %
NEUTROS ABS: 4.6 10*3/uL (ref 1.4–7.0)
NEUTROS PCT: 59 %
PLATELETS: 392 10*3/uL — AB (ref 150–379)
RBC: 4.76 x10E6/uL (ref 3.77–5.28)
RDW: 14.5 % (ref 12.3–15.4)
WBC: 7.8 10*3/uL (ref 3.4–10.8)

## 2017-03-22 LAB — LIPID PANEL W/O CHOL/HDL RATIO
Cholesterol, Total: 190 mg/dL (ref 100–199)
HDL: 41 mg/dL (ref >39)
LDL Calculated: 106 mg/dL — ABNORMAL HIGH (ref 0–99)
TRIGLYCERIDES: 213 mg/dL — AB (ref 0–149)
VLDL Cholesterol Cal: 43 mg/dL — ABNORMAL HIGH (ref 5–40)

## 2017-04-12 ENCOUNTER — Ambulatory Visit
Admission: RE | Admit: 2017-04-12 | Discharge: 2017-04-12 | Disposition: A | Discharge status: Home / Self Care | Payer: BLUE CROSS/BLUE SHIELD | Source: Ambulatory Visit | Attending: Unknown Physician Specialty | Admitting: Unknown Physician Specialty

## 2017-04-12 DIAGNOSIS — Z0000 Encounter for general adult medical examination without abnormal findings: Secondary | ICD-10-CM

## 2017-04-12 DIAGNOSIS — Z1231 Encounter for screening mammogram for malignant neoplasm of breast: Secondary | ICD-10-CM | POA: Insufficient documentation

## 2017-07-18 DIAGNOSIS — R030 Elevated blood-pressure reading, without diagnosis of hypertension: Secondary | ICD-10-CM | POA: Diagnosis not present

## 2017-07-18 DIAGNOSIS — J0140 Acute pansinusitis, unspecified: Secondary | ICD-10-CM | POA: Diagnosis not present

## 2017-08-06 DIAGNOSIS — S93402A Sprain of unspecified ligament of left ankle, initial encounter: Secondary | ICD-10-CM | POA: Diagnosis not present

## 2017-08-10 DIAGNOSIS — M7672 Peroneal tendinitis, left leg: Secondary | ICD-10-CM | POA: Diagnosis not present

## 2018-03-23 ENCOUNTER — Other Ambulatory Visit: Payer: Self-pay

## 2018-03-23 ENCOUNTER — Ambulatory Visit (INDEPENDENT_AMBULATORY_CARE_PROVIDER_SITE_OTHER): Payer: BLUE CROSS/BLUE SHIELD | Admitting: Nurse Practitioner

## 2018-03-23 ENCOUNTER — Encounter: Payer: BLUE CROSS/BLUE SHIELD | Admitting: Unknown Physician Specialty

## 2018-03-23 ENCOUNTER — Encounter: Payer: Self-pay | Admitting: Nurse Practitioner

## 2018-03-23 VITALS — BP 128/74 | HR 84 | Temp 98.4°F | Ht 65.5 in | Wt 204.5 lb

## 2018-03-23 DIAGNOSIS — E6609 Other obesity due to excess calories: Secondary | ICD-10-CM | POA: Diagnosis not present

## 2018-03-23 DIAGNOSIS — Z800 Family history of malignant neoplasm of digestive organs: Secondary | ICD-10-CM | POA: Diagnosis not present

## 2018-03-23 DIAGNOSIS — Z0000 Encounter for general adult medical examination without abnormal findings: Secondary | ICD-10-CM

## 2018-03-23 DIAGNOSIS — Z23 Encounter for immunization: Secondary | ICD-10-CM | POA: Diagnosis not present

## 2018-03-23 DIAGNOSIS — I10 Essential (primary) hypertension: Secondary | ICD-10-CM | POA: Diagnosis not present

## 2018-03-23 DIAGNOSIS — Z6833 Body mass index (BMI) 33.0-33.9, adult: Secondary | ICD-10-CM

## 2018-03-23 NOTE — Progress Notes (Signed)
BP 128/74 (BP Location: Left Arm, Patient Position: Sitting)   Pulse 84   Temp 98.4 F (36.9 C) (Oral)   Ht 5' 5.5" (1.664 m)   Wt 204 lb 8 oz (92.8 kg)   LMP  (LMP Unknown)   SpO2 98%   BMI 33.51 kg/m    Subjective:    Patient ID: Maria Ball, female    DOB: [birth year], 57 y.o.   MRN: [id number]  HPI: Maria Ball is a 57 y.o. female presents for annual physical.  She has no questions or concerns at this time.  Chief Complaint  Patient presents with  . Annual Exam    no concerns   HYPERTENSION No current medications, have been monitoring over past year when initially they noted BP elevation.  Discussed with her monitoring BP at home every day over next 4 weeks and discussed options for monitoring + writing these down for provider.  Will recheck BP in one month and see home readings.  She agrees with this POC. Hypertension status: stable  Satisfied with current treatment? no current treatment Duration of hypertension: started monitoring it last year it "was a little high" BP monitoring frequency:  not checking BP range:  BP medication side effects: no current medications Medication compliance: no current medications Previous BP meds: no current medications Aspirin: no Recurrent headaches: no Visual changes: no Palpitations: no Dyspnea: no Chest pain: no Lower extremity edema: no Dizzy/lightheaded: no   Relevant past medical, surgical, family and social history reviewed and updated as indicated. Interim medical history since our last visit reviewed. Allergies and medications reviewed and updated.  Review of Systems  Constitutional: Negative for activity change, appetite change and fatigue.  HENT: Negative.   Eyes: Negative.   Respiratory: Negative for cough, chest tightness, shortness of breath and wheezing.   Cardiovascular: Negative for chest pain, palpitations and leg swelling.  Gastrointestinal: Negative for abdominal distention, abdominal  pain, constipation, diarrhea, nausea and vomiting.  Endocrine: Negative.   Genitourinary: Negative.   Musculoskeletal: Negative.   Skin: Negative.   Allergic/Immunologic: Negative.   Neurological: Negative for dizziness, syncope, weakness, light-headedness, numbness and headaches.  Hematological: Negative.   Psychiatric/Behavioral: Negative.     Per HPI unless specifically indicated above     Objective:    BP 128/74 (BP Location: Left Arm, Patient Position: Sitting)   Pulse 84   Temp 98.4 F (36.9 C) (Oral)   Ht 5' 5.5" (1.664 m)   Wt 204 lb 8 oz (92.8 kg)   LMP  (LMP Unknown)   SpO2 98%   BMI 33.51 kg/m   Wt Readings from Last 3 Encounters:  03/23/18 204 lb 8 oz (92.8 kg)  03/21/17 203 lb 9.6 oz (92.4 kg)  03/01/16 202 lb (91.6 kg)    Physical Exam  Constitutional: She is oriented to person, place, and time. She appears well-developed and well-nourished.  HENT:  Head: Normocephalic and atraumatic.  Right Ear: Hearing, tympanic membrane, external ear and ear canal normal.  Left Ear: Hearing, tympanic membrane, external ear and ear canal normal.  Nose: Nose normal. Right sinus exhibits no maxillary sinus tenderness and no frontal sinus tenderness. Left sinus exhibits no maxillary sinus tenderness and no frontal sinus tenderness.  Mouth/Throat: Oropharynx is clear and moist.  Eyes: Pupils are equal, round, and reactive to light. Conjunctivae, EOM and lids are normal. Right eye exhibits no discharge. Left eye exhibits no discharge.  Neck: Normal range of motion. Neck supple. No JVD present. Carotid bruit  is not present. No thyromegaly present.  Cardiovascular: Normal rate, regular rhythm, normal heart sounds and intact distal pulses.  Pulmonary/Chest: Effort normal and breath sounds normal. Right breast exhibits no inverted nipple, no mass, no nipple discharge, no skin change and no tenderness. Left breast exhibits no inverted nipple, no mass, no nipple discharge, no skin  change and no tenderness.  Abdominal: Soft. Bowel sounds are normal. There is no splenomegaly or hepatomegaly.  Musculoskeletal: Normal range of motion.  Lymphadenopathy:    She has no cervical adenopathy.  Neurological: She is alert and oriented to person, place, and time. She has normal reflexes.  Reflex Scores:      Brachioradialis reflexes are 2+ on the right side and 2+ on the left side.      Patellar reflexes are 2+ on the right side and 2+ on the left side. Skin: Skin is warm and dry.  Psychiatric: She has a normal mood and affect. Her behavior is normal.    Results for orders placed or performed in visit on 03/21/17  CBC with Differential/Platelet  Result Value Ref Range   WBC 7.8 3.4 - 10.8 x10E3/uL   RBC 4.76 3.77 - 5.28 x10E6/uL   Hemoglobin 12.9 11.1 - 15.9 g/dL   Hematocrit 39.0 34.0 - 46.6 %   MCV 82 79 - 97 fL   MCH 27.1 26.6 - 33.0 pg   MCHC 33.1 31.5 - 35.7 g/dL   RDW 14.5 12.3 - 15.4 %   Platelets 392 (H) 150 - 379 x10E3/uL   Neutrophils 59 Not Estab. %   Lymphs 34 Not Estab. %   Monocytes 6 Not Estab. %   Eos 1 Not Estab. %   Basos 0 Not Estab. %   Neutrophils Absolute 4.6 1.4 - 7.0 x10E3/uL   Lymphocytes Absolute 2.6 0.7 - 3.1 x10E3/uL   Monocytes Absolute 0.5 0.1 - 0.9 x10E3/uL   EOS (ABSOLUTE) 0.1 0.0 - 0.4 x10E3/uL   Basophils Absolute 0.0 0.0 - 0.2 x10E3/uL   Immature Granulocytes 0 Not Estab. %   Immature Grans (Abs) 0.0 0.0 - 0.1 x10E3/uL  Comprehensive metabolic panel  Result Value Ref Range   Glucose 90 65 - 99 mg/dL   BUN 11 6 - 24 mg/dL   Creatinine, Ser 0.72 0.57 - 1.00 mg/dL   GFR calc non Af Amer 93 >59 mL/min/1.73   GFR calc Af Amer 108 >59 mL/min/1.73   BUN/Creatinine Ratio 15 9 - 23   Sodium 139 134 - 144 mmol/L   Potassium 4.5 3.5 - 5.2 mmol/L   Chloride 103 96 - 106 mmol/L   CO2 24 20 - 29 mmol/L   Calcium 9.2 8.7 - 10.2 mg/dL   Total Protein 6.6 6.0 - 8.5 g/dL   Albumin 4.5 3.5 - 5.5 g/dL   Globulin, Total 2.1 1.5 - 4.5 g/dL     Albumin/Globulin Ratio 2.1 1.2 - 2.2   Bilirubin Total <0.2 0.0 - 1.2 mg/dL   Alkaline Phosphatase 161 (H) 39 - 117 IU/L   AST 20 0 - 40 IU/L   ALT 24 0 - 32 IU/L  Lipid Panel w/o Chol/HDL Ratio  Result Value Ref Range   Cholesterol, Total 190 100 - 199 mg/dL   Triglycerides 213 (H) 0 - 149 mg/dL   HDL 41 >39 mg/dL   VLDL Cholesterol Cal 43 (H) 5 - 40 mg/dL   LDL Calculated 106 (H) 0 - 99 mg/dL  TSH  Result Value Ref Range   TSH 1.350 0.450 -  4.500 uIU/mL      Assessment & Plan:   Problem List Items Addressed This Visit      Cardiovascular and Mediastinum   Hypertension    Ongoing, noted initially one year ago.  Has not been monitoring BP at home.  Initial BP elevated above goal today with repeat below goal.  Will have patient monitor BP at home and return in one month with record for assessment.  Initiate medication if indicated. Provided info on DASH diet and regular exercise (30 minutes x 5 days a week).        Other   Obesity    Chronic, no weight loss.  Continue to focus on health diet and regular exercise which offers greatest benefit.       Other Visit Diagnoses    Annual physical exam    -  Primary   Relevant Orders   CBC with Differential/Platelet   Comprehensive metabolic panel   Lipid Panel w/o Chol/HDL Ratio   TSH   Family history of colon cancer       Relevant Orders   Ambulatory referral to Gastroenterology   Flu vaccine need       Relevant Orders   Flu Vaccine QUAD 36+ mos IM (Completed)       Follow up plan: Return in about 1 month (around 04/22/2018) for HTN with BP check.

## 2018-03-23 NOTE — Assessment & Plan Note (Addendum)
Chronic, no weight loss.  Continue to focus on health diet and regular exercise which offers greatest benefit.

## 2018-03-23 NOTE — Assessment & Plan Note (Signed)
Ongoing, noted initially one year ago.  Has not been monitoring BP at home.  Initial BP elevated above goal today with repeat below goal.  Will have patient monitor BP at home and return in one month with record for assessment.  Initiate medication if indicated. Provided info on DASH diet and regular exercise (30 minutes x 5 days a week).

## 2018-03-23 NOTE — Patient Instructions (Addendum)
DASH Eating Plan DASH stands for "Dietary Approaches to Stop Hypertension." The DASH eating plan is a healthy eating plan that has been shown to reduce high blood pressure (hypertension). It may also reduce your risk for type 2 diabetes, heart disease, and stroke. The DASH eating plan may also help with weight loss. What are tips for following this plan? General guidelines  Avoid eating more than 2,300 mg (milligrams) of salt (sodium) a day. If you have hypertension, you may need to reduce your sodium intake to 1,500 mg a day.  Limit alcohol intake to no more than 1 drink a day for nonpregnant women and 2 drinks a day for men. One drink equals 12 oz of beer, 5 oz of wine, or 1 oz of hard liquor.  Work with your health care provider to maintain a healthy body weight or to lose weight. Ask what an ideal weight is for you.  Get at least 30 minutes of exercise that causes your heart to beat faster (aerobic exercise) most days of the week. Activities may include walking, swimming, or biking.  Work with your health care provider or diet and nutrition specialist (dietitian) to adjust your eating plan to your individual calorie needs. Reading food labels  Check food labels for the amount of sodium per serving. Choose foods with less than 5 percent of the Daily Value of sodium. Generally, foods with less than 300 mg of sodium per serving fit into this eating plan.  To find whole grains, look for the word "whole" as the first word in the ingredient list. Shopping  Buy products labeled as "low-sodium" or "no salt added."  Buy fresh foods. Avoid canned foods and premade or frozen meals. Cooking  Avoid adding salt when cooking. Use salt-free seasonings or herbs instead of table salt or sea salt. Check with your health care provider or pharmacist before using salt substitutes.  Do not fry foods. Cook foods using healthy methods such as baking, boiling, grilling, and broiling instead.  Cook with  heart-healthy oils, such as olive, canola, soybean, or sunflower oil. Meal planning   Eat a balanced diet that includes: ? 5 or more servings of fruits and vegetables each day. At each meal, try to fill half of your plate with fruits and vegetables. ? Up to 6-8 servings of whole grains each day. ? Less than 6 oz of lean meat, poultry, or fish each day. A 3-oz serving of meat is about the same size as a deck of cards. One egg equals 1 oz. ? 2 servings of low-fat dairy each day. ? A serving of nuts, seeds, or beans 5 times each week. ? Heart-healthy fats. Healthy fats called Omega-3 fatty acids are found in foods such as flaxseeds and coldwater fish, like sardines, salmon, and mackerel.  Limit how much you eat of the following: ? Canned or prepackaged foods. ? Food that is high in trans fat, such as fried foods. ? Food that is high in saturated fat, such as fatty meat. ? Sweets, desserts, sugary drinks, and other foods with added sugar. ? Full-fat dairy products.  Do not salt foods before eating.  Try to eat at least 2 vegetarian meals each week.  Eat more home-cooked food and less restaurant, buffet, and fast food.  When eating at a restaurant, ask that your food be prepared with less salt or no salt, if possible. What foods are recommended? The items listed may not be a complete list. Talk with your dietitian about what   dietary choices are best for you. Grains Whole-grain or whole-wheat bread. Whole-grain or whole-wheat pasta. Brown rice. Oatmeal. Quinoa. Bulgur. Whole-grain and low-sodium cereals. Pita bread. Low-fat, low-sodium crackers. Whole-wheat flour tortillas. Vegetables Fresh or frozen vegetables (raw, steamed, roasted, or grilled). Low-sodium or reduced-sodium tomato and vegetable juice. Low-sodium or reduced-sodium tomato sauce and tomato paste. Low-sodium or reduced-sodium canned vegetables. Fruits All fresh, dried, or frozen fruit. Canned fruit in natural juice (without  added sugar). Meat and other protein foods Skinless chicken or turkey. Ground chicken or turkey. Pork with fat trimmed off. Fish and seafood. Egg whites. Dried beans, peas, or lentils. Unsalted nuts, nut butters, and seeds. Unsalted canned beans. Lean cuts of beef with fat trimmed off. Low-sodium, lean deli meat. Dairy Low-fat (1%) or fat-free (skim) milk. Fat-free, low-fat, or reduced-fat cheeses. Nonfat, low-sodium ricotta or cottage cheese. Low-fat or nonfat yogurt. Low-fat, low-sodium cheese. Fats and oils Soft margarine without trans fats. Vegetable oil. Low-fat, reduced-fat, or light mayonnaise and salad dressings (reduced-sodium). Canola, safflower, olive, soybean, and sunflower oils. Avocado. Seasoning and other foods Herbs. Spices. Seasoning mixes without salt. Unsalted popcorn and pretzels. Fat-free sweets. What foods are not recommended? The items listed may not be a complete list. Talk with your dietitian about what dietary choices are best for you. Grains Baked goods made with fat, such as croissants, muffins, or some breads. Dry pasta or rice meal packs. Vegetables Creamed or fried vegetables. Vegetables in a cheese sauce. Regular canned vegetables (not low-sodium or reduced-sodium). Regular canned tomato sauce and paste (not low-sodium or reduced-sodium). Regular tomato and vegetable juice (not low-sodium or reduced-sodium). Pickles. Olives. Fruits Canned fruit in a light or heavy syrup. Fried fruit. Fruit in cream or butter sauce. Meat and other protein foods Fatty cuts of meat. Ribs. Fried meat. Bacon. Sausage. Bologna and other processed lunch meats. Salami. Fatback. Hotdogs. Bratwurst. Salted nuts and seeds. Canned beans with added salt. Canned or smoked fish. Whole eggs or egg yolks. Chicken or turkey with skin. Dairy Whole or 2% milk, cream, and half-and-half. Whole or full-fat cream cheese. Whole-fat or sweetened yogurt. Full-fat cheese. Nondairy creamers. Whipped toppings.  Processed cheese and cheese spreads. Fats and oils Butter. Stick margarine. Lard. Shortening. Ghee. Bacon fat. Tropical oils, such as coconut, palm kernel, or palm oil. Seasoning and other foods Salted popcorn and pretzels. Onion salt, garlic salt, seasoned salt, table salt, and sea salt. Worcestershire sauce. Tartar sauce. Barbecue sauce. Teriyaki sauce. Soy sauce, including reduced-sodium. Steak sauce. Canned and packaged gravies. Fish sauce. Oyster sauce. Cocktail sauce. Horseradish that you find on the shelf. Ketchup. Mustard. Meat flavorings and tenderizers. Bouillon cubes. Hot sauce and Tabasco sauce. Premade or packaged marinades. Premade or packaged taco seasonings. Relishes. Regular salad dressings. Where to find more information:  National Heart, Lung, and Blood Institute: www.nhlbi.nih.gov  American Heart Association: www.heart.org Summary  The DASH eating plan is a healthy eating plan that has been shown to reduce high blood pressure (hypertension). It may also reduce your risk for type 2 diabetes, heart disease, and stroke.  With the DASH eating plan, you should limit salt (sodium) intake to 2,300 mg a day. If you have hypertension, you may need to reduce your sodium intake to 1,500 mg a day.  When on the DASH eating plan, aim to eat more fresh fruits and vegetables, whole grains, lean proteins, low-fat dairy, and heart-healthy fats.  Work with your health care provider or diet and nutrition specialist (dietitian) to adjust your eating plan to your individual   calorie needs. This information is not intended to replace advice given to you by your health care provider. Make sure you discuss any questions you have with your health care provider. Document Released: 04/14/2011 Document Revised: 04/18/2016 Document Reviewed: 04/18/2016 Elsevier Interactive Patient Education  2018 [employer].  Exercising to [employer] Exercising can help you to lose weight. In order to lose weight  through exercise, you need to do vigorous-intensity exercise. You can tell that you are exercising with vigorous intensity if you are breathing very hard and fast and cannot hold a conversation while exercising. Moderate-intensity exercise helps to maintain your current weight. You can tell that you are exercising at a moderate level if you have a higher heart rate and faster breathing, but you are still able to hold a conversation. How often should I exercise? Choose an activity that you enjoy and set realistic goals. Your health care provider can help you to make an activity plan that works for you. Exercise regularly as directed by your health care provider. This may include:  Doing resistance training twice each week, such as: ? Push-ups. ? Sit-ups. ? Lifting weights. ? Using resistance bands.  Doing a given intensity of exercise for a given amount of time. Choose from these options: ? 150 minutes of moderate-intensity exercise every week. ? 75 minutes of vigorous-intensity exercise every week. ? A mix of moderate-intensity and vigorous-intensity exercise every week.  Children, pregnant women, people who are out of shape, people who are overweight, and older adults may need to consult a health care provider for individual recommendations. If you have any sort of medical condition, be sure to consult your health care provider before starting a new exercise program. What are some activities that can help me to lose weight?  Walking at a rate of at least 4.5 miles an hour.  Jogging or running at a rate of 5 miles per hour.  Biking at a rate of at least 10 miles per hour.  Lap swimming.  Roller-skating or in-line skating.  Cross-country skiing.  Vigorous competitive sports, such as football, basketball, and soccer.  Jumping rope.  Aerobic dancing. How can I be more active in my day-to-day activities?  Use the stairs instead of the elevator.  Take a walk during your lunch  break.  If you drive, park your car farther away from work or school.  If you take public transportation, get off one stop early and walk the rest of the way.  Make all of your phone calls while standing up and walking around.  Get up, stretch, and walk around every 30 minutes throughout the day. What guidelines should I follow while exercising?  Do not exercise so much that you hurt yourself, feel dizzy, or get very short of breath.  Consult your health care provider prior to starting a new exercise program.  Wear comfortable clothes and shoes with good support.  Drink plenty of water while you exercise to prevent dehydration or heat stroke. Body water is lost during exercise and must be replaced.  Work out until you breathe faster and your heart beats faster. This information is not intended to replace advice given to you by your health care provider. Make sure you discuss any questions you have with your health care provider. Document Released: 05/28/2010 Document Revised: 10/01/2015 Document Reviewed: 09/26/2013 Elsevier Interactive Patient Education  [employer].

## 2018-03-24 LAB — LIPID PANEL W/O CHOL/HDL RATIO
Cholesterol, Total: 178 mg/dL (ref 100–199)
HDL: 39 mg/dL — ABNORMAL LOW
LDL Calculated: 93 mg/dL (ref 0–99)
Triglycerides: 231 mg/dL — ABNORMAL HIGH (ref 0–149)
VLDL Cholesterol Cal: 46 mg/dL — ABNORMAL HIGH (ref 5–40)

## 2018-03-24 LAB — CBC WITH DIFFERENTIAL/PLATELET
Basophils Absolute: 0.1 x10E3/uL (ref 0.0–0.2)
Basos: 1 %
EOS (ABSOLUTE): 0.1 x10E3/uL (ref 0.0–0.4)
Eos: 1 %
Hematocrit: 39.3 % (ref 34.0–46.6)
Hemoglobin: 13.2 g/dL (ref 11.1–15.9)
Immature Grans (Abs): 0 x10E3/uL (ref 0.0–0.1)
Immature Granulocytes: 0 %
Lymphocytes Absolute: 2.4 x10E3/uL (ref 0.7–3.1)
Lymphs: 33 %
MCH: 26.9 pg (ref 26.6–33.0)
MCHC: 33.6 g/dL (ref 31.5–35.7)
MCV: 80 fL (ref 79–97)
Monocytes Absolute: 0.5 x10E3/uL (ref 0.1–0.9)
Monocytes: 7 %
Neutrophils Absolute: 4.2 x10E3/uL (ref 1.4–7.0)
Neutrophils: 58 %
Platelets: 406 x10E3/uL (ref 150–450)
RBC: 4.9 x10E6/uL (ref 3.77–5.28)
RDW: 13.4 % (ref 12.3–15.4)
WBC: 7.2 x10E3/uL (ref 3.4–10.8)

## 2018-03-24 LAB — COMPREHENSIVE METABOLIC PANEL
ALBUMIN: 4.2 g/dL (ref 3.5–5.5)
ALK PHOS: 166 IU/L — AB (ref 39–117)
ALT: 20 IU/L (ref 0–32)
AST: 17 IU/L (ref 0–40)
Albumin/Globulin Ratio: 1.8 (ref 1.2–2.2)
BUN / CREAT RATIO: 11 (ref 9–23)
BUN: 7 mg/dL (ref 6–24)
Bilirubin Total: <0.2 mg/dL (ref 0.0–1.2)
CO2: 24 mmol/L (ref 20–29)
Calcium: 9.2 mg/dL (ref 8.7–10.2)
Chloride: 105 mmol/L (ref 96–106)
Creatinine, Ser: 0.65 mg/dL (ref 0.57–1.00)
GFR calc Af Amer: 113 mL/min/{1.73_m2} (ref >59)
GFR calc non Af Amer: 98 mL/min/{1.73_m2} (ref >59)
GLUCOSE: 91 mg/dL (ref 65–99)
Globulin, Total: 2.4 g/dL (ref 1.5–4.5)
Potassium: 4.3 mmol/L (ref 3.5–5.2)
SODIUM: 144 mmol/L (ref 134–144)
Total Protein: 6.6 g/dL (ref 6.0–8.5)

## 2018-03-24 LAB — TSH: TSH: 1.36 u[IU]/mL (ref 0.450–4.500)

## 2018-03-26 NOTE — Addendum Note (Signed)
Addended by: Marnee Guarneri T on: 03/26/2018 03:55 PM   Modules accepted: Orders

## 2018-03-27 ENCOUNTER — Other Ambulatory Visit: Payer: Self-pay | Admitting: Nurse Practitioner

## 2018-03-27 DIAGNOSIS — Z1231 Encounter for screening mammogram for malignant neoplasm of breast: Secondary | ICD-10-CM

## 2018-04-04 ENCOUNTER — Other Ambulatory Visit: Payer: Self-pay

## 2018-04-04 DIAGNOSIS — Z86010 Personal history of colonic polyps: Secondary | ICD-10-CM

## 2018-04-11 ENCOUNTER — Other Ambulatory Visit: Payer: Self-pay

## 2018-04-11 MED ORDER — NA SULFATE-K SULFATE-MG SULF 17.5-3.13-1.6 GM/177ML PO SOLN: 1.0000 | Freq: Once | ORAL | 0 refills | Status: AC

## 2018-04-16 ENCOUNTER — Ambulatory Visit: Payer: BLUE CROSS/BLUE SHIELD | Admitting: Anesthesiology

## 2018-04-16 ENCOUNTER — Encounter
Admission: RE | Disposition: A | Discharge status: Home / Self Care | Payer: Self-pay | Source: Ambulatory Visit | Attending: Gastroenterology

## 2018-04-16 ENCOUNTER — Encounter: Payer: Self-pay | Admitting: Anesthesiology

## 2018-04-16 ENCOUNTER — Ambulatory Visit
Admission: RE | Admit: 2018-04-16 | Discharge: 2018-04-16 | Disposition: A | Discharge status: Home / Self Care | Payer: BLUE CROSS/BLUE SHIELD | Source: Ambulatory Visit | Attending: Gastroenterology | Admitting: Gastroenterology

## 2018-04-16 DIAGNOSIS — D124 Benign neoplasm of descending colon: Secondary | ICD-10-CM | POA: Insufficient documentation

## 2018-04-16 DIAGNOSIS — Z800 Family history of malignant neoplasm of digestive organs: Secondary | ICD-10-CM | POA: Insufficient documentation

## 2018-04-16 DIAGNOSIS — Z86010 Personal history of colonic polyps: Secondary | ICD-10-CM | POA: Diagnosis not present

## 2018-04-16 DIAGNOSIS — K635 Polyp of colon: Secondary | ICD-10-CM | POA: Diagnosis not present

## 2018-04-16 DIAGNOSIS — I10 Essential (primary) hypertension: Secondary | ICD-10-CM | POA: Diagnosis not present

## 2018-04-16 DIAGNOSIS — D120 Benign neoplasm of cecum: Secondary | ICD-10-CM

## 2018-04-16 DIAGNOSIS — D122 Benign neoplasm of ascending colon: Secondary | ICD-10-CM | POA: Diagnosis not present

## 2018-04-16 DIAGNOSIS — Z1211 Encounter for screening for malignant neoplasm of colon: Secondary | ICD-10-CM | POA: Insufficient documentation

## 2018-04-16 HISTORY — PX: COLONOSCOPY WITH PROPOFOL: SHX5780

## 2018-04-16 SURGERY — COLONOSCOPY WITH PROPOFOL
Anesthesia: General

## 2018-04-16 MED ORDER — SODIUM CHLORIDE 0.9 % IV SOLN
INTRAVENOUS | Status: DC
  Administered 2018-04-16: 1000 mL via INTRAVENOUS

## 2018-04-16 MED ORDER — PROPOFOL 500 MG/50ML IV EMUL
INTRAVENOUS | Status: DC | PRN
  Administered 2018-04-16: 120 ug/kg/min via INTRAVENOUS

## 2018-04-16 MED ORDER — PROPOFOL 10 MG/ML IV BOLUS
INTRAVENOUS | Status: DC | PRN
  Administered 2018-04-16: 30 mg via INTRAVENOUS
  Administered 2018-04-16: 70 mg via INTRAVENOUS

## 2018-04-16 MED ORDER — LIDOCAINE 2% (20 MG/ML) 5 ML SYRINGE
INTRAMUSCULAR | Status: DC | PRN
  Administered 2018-04-16: 25 mg via INTRAVENOUS

## 2018-04-16 NOTE — Op Note (Signed)
[facility] Gastroenterology Patient Name: Maria Ball Procedure Date: 04/16/2018 9:52 AM MRN: [id number] Account #: [id number] Date of Birth: [birth year] Admit Type: Outpatient Age: 57 Room: [facility] ENDO ROOM 4 Gender: Female Note Status: Finalized Procedure:            Colonoscopy Indications:          High risk colon cancer surveillance: Personal history                        of colonic polyps Providers:            Jonathon Bellows MD, MD Referring MD:         No Local Md, MD (Referring MD) Medicines:            Monitored Anesthesia Care Complications:        No immediate complications. Procedure:            Pre-Anesthesia Assessment:                       - Prior to the procedure, a History and Physical was                        performed, and patient medications, allergies and                        sensitivities were reviewed. The patient's tolerance of                        previous anesthesia was reviewed.                       - The risks and benefits of the procedure and the                        sedation options and risks were discussed with the                        patient. All questions were answered and informed                        consent was obtained.                       - ASA Grade Assessment: II - A patient with mild                        systemic disease.                       After obtaining informed consent, the colonoscope was                        passed under direct vision. Throughout the procedure,                        the patient's blood pressure, pulse, and oxygen                        saturations were monitored continuously. The  Colonoscope was introduced through the anus and                        advanced to the the cecum, identified by the                        appendiceal orifice, IC valve and transillumination.                        The colonoscopy was performed with ease. The patient              tolerated the procedure well. The quality of the bowel                        preparation was good. Findings:      The perianal and digital rectal examinations were normal.      Two sessile polyps were found in the ascending colon. The polyps were 4       to 6 mm in size. These polyps were removed with a cold snare. Resection       and retrieval were complete.      Two sessile polyps were found in the descending colon and ascending       colon. The polyps were 3 to 4 mm in size. These polyps were removed with       a cold biopsy forceps. Resection and retrieval were complete. To prevent       bleeding after the polypectomy, one hemostatic clip was successfully       placed. There was no bleeding during, or at the end, of the procedure.       clip placed on descending colon polyp[      Two sessile polyps were found in the cecum. The polyps were 3 to 4 mm in       size. These polyps were removed with a cold biopsy forceps. Resection       and retrieval were complete.      A 6 mm polyp was found in the cecum. The polyp was sessile. The polyp       was removed with a cold snare. Resection and retrieval were complete.      A 5 mm polyp was found in the descending colon. The polyp was sessile.       The polyp was removed with a cold snare. Resection and retrieval were       complete. Impression:           - Two 4 to 6 mm polyps in the ascending colon, removed                        with a cold snare. Resected and retrieved.                       - Two 3 to 4 mm polyps in the descending colon and in                        the ascending colon, removed with a cold biopsy                        forceps. Resected and retrieved. Clip was placed.                       -  Two 3 to 4 mm polyps in the cecum, removed with a                        cold biopsy forceps. Resected and retrieved.                       - One 6 mm polyp in the cecum, removed with a cold                        snare.  Resected and retrieved.                       - One 5 mm polyp in the descending colon, removed with                        a cold snare. Resected and retrieved. Recommendation:       - Discharge patient to home (with escort).                       - Resume previous diet.                       - Continue present medications.                       - Await pathology results.                       - Repeat colonoscopy in 3 years for surveillance based                        on pathology results.                       - consider genetic testing due to family history of                        colon cancer and number of colon polyps Procedure Code(s):    --- Professional ---                       [phone], Colonoscopy, flexible; with removal of tumor(s),                        polyp(s), or other lesion(s) by snare technique                       [id number], 34, Colonoscopy, flexible; with biopsy, single                        or multiple Diagnosis Code(s):    --- Professional ---                       Z86.010, Personal history of colonic polyps                       D12.4, Benign neoplasm of descending colon                       D12.2, Benign neoplasm of ascending colon  D12.0, Benign neoplasm of cecum CPT copyright 2018 American Medical Association. All rights reserved. The codes documented in this report are preliminary and upon coder review may  be revised to meet current compliance requirements. Jonathon Bellows, MD Jonathon Bellows MD, MD 04/16/2018 10:40:03 AM This report has been signed electronically. Number of Addenda: 0 Note Initiated On: 04/16/2018 9:52 AM Scope Withdrawal Time: 0 hours 22 minutes 56 seconds  Total Procedure Duration: 0 hours 27 minutes 59 seconds       [facility]

## 2018-04-16 NOTE — Anesthesia Preprocedure Evaluation (Signed)
Anesthesia Evaluation  Patient identified by MRN, date of birth, ID band Patient awake    Reviewed: Allergy & Precautions, H&P , NPO status , Patient's Chart, lab work & pertinent test results  History of Anesthesia Complications Negative for: history of anesthetic complications  Airway Mallampati: III  TM Distance: <3 FB Neck ROM: full    Dental  (+) Chipped   Pulmonary neg pulmonary ROS, neg shortness of breath,           Cardiovascular Exercise Tolerance: Good hypertension, (-) angina(-) Past MI and (-) DOE      Neuro/Psych PSYCHIATRIC DISORDERS negative neurological ROS     GI/Hepatic negative GI ROS, Neg liver ROS, neg GERD  ,  Endo/Other  negative endocrine ROS  Renal/GU negative Renal ROS  negative genitourinary   Musculoskeletal   Abdominal   Peds  Hematology negative hematology ROS (+)   Anesthesia Other Findings Past Medical History: No date: Alkaline phosphatase elevation No date: Depression 12/11/2014: Depression No date: Hypertension  Past Surgical History: No date: TONSILLECTOMY  BMI    Body Mass Index:  32.28 kg/m      Reproductive/Obstetrics negative OB ROS                             Anesthesia Physical Anesthesia Plan  ASA: III  Anesthesia Plan: General   Post-op Pain Management:    Induction: Intravenous  PONV Risk Score and Plan: Propofol infusion and TIVA  Airway Management Planned: Natural Airway and Nasal Cannula  Additional Equipment:   Intra-op Plan:   Post-operative Plan:   Informed Consent: I have reviewed the patients History and Physical, chart, labs and discussed the procedure including the risks, benefits and alternatives for the proposed anesthesia with the patient or authorized representative who has indicated his/her understanding and acceptance.   Dental Advisory Given  Plan Discussed with: Anesthesiologist, CRNA and  Surgeon  Anesthesia Plan Comments: (Patient consented for risks of anesthesia including but not limited to:  - adverse reactions to medications - risk of intubation if required - damage to teeth, lips or other oral mucosa - sore throat or hoarseness - Damage to heart, brain, lungs or loss of life  Patient voiced understanding.)        Anesthesia Quick Evaluation

## 2018-04-16 NOTE — Transfer of Care (Signed)
Immediate Anesthesia Transfer of Care Note  Patient: Maria Ball  Procedure(s) Performed: COLONOSCOPY WITH PROPOFOL (N/A )  Patient Location: Endoscopy Unit  Anesthesia Type:General  Level of Consciousness: awake and alert   Airway & Oxygen Therapy: Patient Spontanous Breathing  Post-op Assessment: Report given to RN and Post -op Vital signs reviewed and stable  Post vital signs: Reviewed  Last Vitals:  Vitals Value Taken Time  BP 110/68 04/16/2018 10:42 AM  Temp 36.1 C 04/16/2018 10:41 AM  Pulse 87 04/16/2018 10:42 AM  Resp 19 04/16/2018 10:42 AM  SpO2 99 % 04/16/2018 10:42 AM  Vitals shown include unvalidated device data.  Last Pain:  Vitals:   04/16/18 0917  PainSc: 0-No pain         Complications: No apparent anesthesia complications

## 2018-04-16 NOTE — Anesthesia Postprocedure Evaluation (Signed)
Anesthesia Post Note  Patient: Maria Ball  Procedure(s) Performed: COLONOSCOPY WITH PROPOFOL (N/A )  Patient location during evaluation: Endoscopy Anesthesia Type: General Level of consciousness: awake and alert Pain management: pain level controlled Vital Signs Assessment: post-procedure vital signs reviewed and stable Respiratory status: spontaneous breathing, nonlabored ventilation, respiratory function stable and patient connected to nasal cannula oxygen Cardiovascular status: blood pressure returned to baseline and stable Postop Assessment: no apparent nausea or vomiting Anesthetic complications: no     Last Vitals:  Vitals:   04/16/18 1101 04/16/18 1111  BP: 136/71 130/67  Pulse: 76 71  Resp: 15 15  Temp:    SpO2: 100% 100%    Last Pain:  Vitals:   04/16/18 1111  TempSrc:   PainSc: 0-No pain                 Precious Haws Buryl Bamber

## 2018-04-16 NOTE — H&P (Signed)
Jonathon Bellows, MD [address], [place], [place], [place], [address] [address], [place], [place], [place], [address] Phone: [phone]  Fax: [phone]  Primary Care Physician:  Venita Lick, NP   Pre-Procedure History & Physical: HPI:  Maria Ball is a 57 y.o. female is here for an colonoscopy.   Past Medical History:  Diagnosis Date  . Alkaline phosphatase elevation   . Depression   . Depression 12/11/2014  . Hypertension     Past Surgical History:  Procedure Laterality Date  . TONSILLECTOMY      Prior to Admission medications   Medication Sig Start Date End Date Taking? Authorizing Provider  sodium fluoride (PREVIDENT 5000 PLUS) 1.1 % CREA dental cream Place 1 application every evening onto teeth.    [provider]    Allergies as of 04/04/2018  . (No Known Allergies)    Family History  Problem Relation Age of Onset  . Cancer Mother        colon-rectal  . Emphysema Father   . Cancer Sister        breast  . Hypertension Sister   . Diabetes Sister   . Breast cancer Sister 80  . Stroke Sister   . Hypertension Brother     Social History   Socioeconomic History  . Marital status: Divorced    Spouse name: Not on file  . Number of children: Not on file  . Years of education: Not on file  . Highest education level: Not on file  Occupational History  . Not on file  Social Needs  . Financial resource strain: Not on file  . Food insecurity:    Worry: Not on file    Inability: Not on file  . Transportation needs:    Medical: Not on file    Non-medical: Not on file  Tobacco Use  . Smoking status: Never Smoker  . Smokeless tobacco: Never Used  Substance and Sexual Activity  . Alcohol use: No    Alcohol/week: 0.0 standard drinks  . Drug use: No  . Sexual activity: Never  Lifestyle  . Physical activity:    Days per week: Not on file    Minutes per session: Not on file  . Stress: Not on file  Relationships  . Social  connections:    Talks on phone: Not on file    Gets together: Not on file    Attends religious service: Not on file    Active member of club or organization: Not on file    Attends meetings of clubs or organizations: Not on file    Relationship status: Not on file  . Intimate partner violence:    Fear of current or ex partner: Not on file    Emotionally abused: Not on file    Physically abused: Not on file    Forced sexual activity: Not on file  Other Topics Concern  . Not on file  Social History Narrative  . Not on file    Review of Systems: See HPI, otherwise negative ROS  Physical Exam: BP 139/85   Pulse 88   Temp (!) 96.2 F (35.7 C)   Resp 16   Ht 5\' 6"  (1.676 m)   Wt 90.7 kg   LMP  (LMP Unknown)   SpO2 98%   BMI 32.28 kg/m  General:   Alert,  pleasant and cooperative in NAD Head:  Normocephalic and atraumatic. Neck:  Supple; no masses or thyromegaly. Lungs:  Clear throughout  to auscultation, normal respiratory effort.    Heart:  +S1, +S2, Regular rate and rhythm, No edema. Abdomen:  Soft, nontender and nondistended. Normal bowel sounds, without guarding, and without rebound.   Neurologic:  Alert and  oriented x4;  grossly normal neurologically.  Impression/Plan: Maria Ball is here for an colonoscopy to be performed for surveillance due to prior history of colon polyps   Risks, benefits, limitations, and alternatives regarding  colonoscopy have been reviewed with the patient.  Questions have been answered.  All parties agreeable.   Jonathon Bellows, MD  04/16/2018, 9:53 AM

## 2018-04-16 NOTE — Anesthesia Post-op Follow-up Note (Signed)
Anesthesia QCDR form completed.        

## 2018-04-17 LAB — SURGICAL PATHOLOGY

## 2018-04-20 ENCOUNTER — Ambulatory Visit
Admission: RE | Admit: 2018-04-20 | Discharge: 2018-04-20 | Disposition: A | Discharge status: Home / Self Care | Payer: BLUE CROSS/BLUE SHIELD | Source: Ambulatory Visit | Attending: Nurse Practitioner | Admitting: Nurse Practitioner

## 2018-04-20 DIAGNOSIS — Z1231 Encounter for screening mammogram for malignant neoplasm of breast: Secondary | ICD-10-CM

## 2018-04-27 ENCOUNTER — Encounter: Payer: Self-pay | Admitting: Nurse Practitioner

## 2018-04-27 ENCOUNTER — Ambulatory Visit (INDEPENDENT_AMBULATORY_CARE_PROVIDER_SITE_OTHER): Payer: BLUE CROSS/BLUE SHIELD | Admitting: Nurse Practitioner

## 2018-04-27 VITALS — BP 128/68 | HR 89 | Temp 98.4°F | Ht 65.5 in | Wt 200.0 lb

## 2018-04-27 DIAGNOSIS — I10 Essential (primary) hypertension: Secondary | ICD-10-CM | POA: Diagnosis not present

## 2018-04-27 NOTE — Patient Instructions (Signed)
DASH Eating Plan  DASH stands for "Dietary Approaches to Stop Hypertension." The DASH eating plan is a healthy eating plan that has been shown to reduce high blood pressure (hypertension). It may also reduce your risk for type 2 diabetes, heart disease, and stroke. The DASH eating plan may also help with weight loss.  What are tips for following this plan?    General guidelines   Avoid eating more than 2,300 mg (milligrams) of salt (sodium) a day. If you have hypertension, you may need to reduce your sodium intake to 1,500 mg a day.   Limit alcohol intake to no more than 1 drink a day for nonpregnant women and 2 drinks a day for men. One drink equals 12 oz of beer, 5 oz of wine, or 1 oz of hard liquor.   Work with your health care provider to maintain a healthy body weight or to lose weight. Ask what an ideal weight is for you.   Get at least 30 minutes of exercise that causes your heart to beat faster (aerobic exercise) most days of the week. Activities may include walking, swimming, or biking.   Work with your health care provider or diet and nutrition specialist (dietitian) to adjust your eating plan to your individual calorie needs.  Reading food labels     Check food labels for the amount of sodium per serving. Choose foods with less than 5 percent of the Daily Value of sodium. Generally, foods with less than 300 mg of sodium per serving fit into this eating plan.   To find whole grains, look for the word "whole" as the first word in the ingredient list.  Shopping   Buy products labeled as "low-sodium" or "no salt added."   Buy fresh foods. Avoid canned foods and premade or frozen meals.  Cooking   Avoid adding salt when cooking. Use salt-free seasonings or herbs instead of table salt or sea salt. Check with your health care provider or pharmacist before using salt substitutes.   Do not fry foods. Cook foods using healthy methods such as baking, boiling, grilling, and broiling instead.   Cook with  heart-healthy oils, such as olive, canola, soybean, or sunflower oil.  Meal planning   Eat a balanced diet that includes:  ? 5 or more servings of fruits and vegetables each day. At each meal, try to fill half of your plate with fruits and vegetables.  ? Up to 6-8 servings of whole grains each day.  ? Less than 6 oz of lean meat, poultry, or fish each day. A 3-oz serving of meat is about the same size as a deck of cards. One egg equals 1 oz.  ? 2 servings of low-fat dairy each day.  ? A serving of nuts, seeds, or beans 5 times each week.  ? Heart-healthy fats. Healthy fats called Omega-3 fatty acids are found in foods such as flaxseeds and coldwater fish, like sardines, salmon, and mackerel.   Limit how much you eat of the following:  ? Canned or prepackaged foods.  ? Food that is high in trans fat, such as fried foods.  ? Food that is high in saturated fat, such as fatty meat.  ? Sweets, desserts, sugary drinks, and other foods with added sugar.  ? Full-fat dairy products.   Do not salt foods before eating.   Try to eat at least 2 vegetarian meals each week.   Eat more home-cooked food and less restaurant, buffet, and fast food.     When eating at a restaurant, ask that your food be prepared with less salt or no salt, if possible.  What foods are recommended?  The items listed may not be a complete list. Talk with your dietitian about what dietary choices are best for you.  Grains  Whole-grain or whole-wheat bread. Whole-grain or whole-wheat pasta. Brown rice. Oatmeal. Quinoa. Bulgur. Whole-grain and low-sodium cereals. Pita bread. Low-fat, low-sodium crackers. Whole-wheat flour tortillas.  Vegetables  Fresh or frozen vegetables (raw, steamed, roasted, or grilled). Low-sodium or reduced-sodium tomato and vegetable juice. Low-sodium or reduced-sodium tomato sauce and tomato paste. Low-sodium or reduced-sodium canned vegetables.  Fruits  All fresh, dried, or frozen fruit. Canned fruit in natural juice (without  added sugar).  Meat and other protein foods  Skinless chicken or turkey. Ground chicken or turkey. Pork with fat trimmed off. Fish and seafood. Egg whites. Dried beans, peas, or lentils. Unsalted nuts, nut butters, and seeds. Unsalted canned beans. Lean cuts of beef with fat trimmed off. Low-sodium, lean deli meat.  Dairy  Low-fat (1%) or fat-free (skim) milk. Fat-free, low-fat, or reduced-fat cheeses. Nonfat, low-sodium ricotta or cottage cheese. Low-fat or nonfat yogurt. Low-fat, low-sodium cheese.  Fats and oils  Soft margarine without trans fats. Vegetable oil. Low-fat, reduced-fat, or light mayonnaise and salad dressings (reduced-sodium). Canola, safflower, olive, soybean, and sunflower oils. Avocado.  Seasoning and other foods  Herbs. Spices. Seasoning mixes without salt. Unsalted popcorn and pretzels. Fat-free sweets.  What foods are not recommended?  The items listed may not be a complete list. Talk with your dietitian about what dietary choices are best for you.  Grains  Baked goods made with fat, such as croissants, muffins, or some breads. Dry pasta or rice meal packs.  Vegetables  Creamed or fried vegetables. Vegetables in a cheese sauce. Regular canned vegetables (not low-sodium or reduced-sodium). Regular canned tomato sauce and paste (not low-sodium or reduced-sodium). Regular tomato and vegetable juice (not low-sodium or reduced-sodium). Pickles. Olives.  Fruits  Canned fruit in a light or heavy syrup. Fried fruit. Fruit in cream or butter sauce.  Meat and other protein foods  Fatty cuts of meat. Ribs. Fried meat. Bacon. Sausage. Bologna and other processed lunch meats. Salami. Fatback. Hotdogs. Bratwurst. Salted nuts and seeds. Canned beans with added salt. Canned or smoked fish. Whole eggs or egg yolks. Chicken or turkey with skin.  Dairy  Whole or 2% milk, cream, and half-and-half. Whole or full-fat cream cheese. Whole-fat or sweetened yogurt. Full-fat cheese. Nondairy creamers. Whipped toppings.  Processed cheese and cheese spreads.  Fats and oils  Butter. Stick margarine. Lard. Shortening. Ghee. Bacon fat. Tropical oils, such as coconut, palm kernel, or palm oil.  Seasoning and other foods  Salted popcorn and pretzels. Onion salt, garlic salt, seasoned salt, table salt, and sea salt. Worcestershire sauce. Tartar sauce. Barbecue sauce. Teriyaki sauce. Soy sauce, including reduced-sodium. Steak sauce. Canned and packaged gravies. Fish sauce. Oyster sauce. Cocktail sauce. Horseradish that you find on the shelf. Ketchup. Mustard. Meat flavorings and tenderizers. Bouillon cubes. Hot sauce and Tabasco sauce. Premade or packaged marinades. Premade or packaged taco seasonings. Relishes. Regular salad dressings.  Where to find more information:   National Heart, Lung, and Blood Institute: www.nhlbi.nih.gov   American Heart Association: www.heart.org  Summary   The DASH eating plan is a healthy eating plan that has been shown to reduce high blood pressure (hypertension). It may also reduce your risk for type 2 diabetes, heart disease, and stroke.   With the   DASH eating plan, you should limit salt (sodium) intake to 2,300 mg a day. If you have hypertension, you may need to reduce your sodium intake to 1,500 mg a day.   When on the DASH eating plan, aim to eat more fresh fruits and vegetables, whole grains, lean proteins, low-fat dairy, and heart-healthy fats.   Work with your health care provider or diet and nutrition specialist (dietitian) to adjust your eating plan to your individual calorie needs.  This information is not intended to replace advice given to you by your health care provider. Make sure you discuss any questions you have with your health care provider.  Document Released: 04/14/2011 Document Revised: 04/18/2016 Document Reviewed: 04/18/2016  Elsevier Interactive Patient Education  2019 [employer]

## 2018-04-27 NOTE — Assessment & Plan Note (Addendum)
BP varies at home with frequent BP >130/90.  She is not interested in medication at this time.  Will continue focus on DASH diet, decrease soda intake, and exercise (walking 30 minutes at a time 5 days a week).  Return in 2 months for reassessment.

## 2018-04-27 NOTE — Progress Notes (Signed)
BP 128/68 (BP Location: Left Arm, Patient Position: Sitting)   Pulse 89   Temp 98.4 F (36.9 C) (Oral)   Ht 5' 5.5" (1.664 m)   Wt 200 lb (90.7 kg)   LMP  (LMP Unknown)   SpO2 98%   BMI 32.78 kg/m    Subjective:    Patient ID: Maria Ball, female    DOB: [birth year], 57 y.o.   MRN: [id number]  HPI: Maria Ball is a 57 y.o. female presents for HTN check  Chief Complaint  Patient presents with  . Follow-up  . Hypertension    130-150s at home. Checking BID at home.    HYPERTENSION BP range 119/84 to 157/62 over past month, with average often above goal.  She is not interested in medication at this time.  Wishes to continue to focus on diet and exercise.  She is very adherent to BP checks at home, checking twice a day. Hypertension status: controlled  Satisfied with current treatment? no meds at this time Duration of hypertension: months BP monitoring frequency:  daily BP range:  Aspirin: no Recurrent headaches: no Visual changes: no Palpitations: no Dyspnea: no Chest pain: no Lower extremity edema: no Dizzy/lightheaded: no  Relevant past medical, surgical, family and social history reviewed and updated as indicated. Interim medical history since our last visit reviewed. Allergies and medications reviewed and updated.  Review of Systems  Constitutional: Negative for activity change, appetite change and fatigue.  Respiratory: Negative for cough, chest tightness and shortness of breath.   Cardiovascular: Negative for chest pain, palpitations and leg swelling.  Gastrointestinal: Negative for abdominal distention, abdominal pain, constipation, diarrhea, nausea and vomiting.  Endocrine: Negative.   Musculoskeletal: Negative.   Neurological: Negative for dizziness, syncope, weakness, light-headedness, numbness and headaches.  Psychiatric/Behavioral: Negative.     Per HPI unless specifically indicated above     Objective:    BP 128/68 (BP Location:  Left Arm, Patient Position: Sitting)   Pulse 89   Temp 98.4 F (36.9 C) (Oral)   Ht 5' 5.5" (1.664 m)   Wt 200 lb (90.7 kg)   LMP  (LMP Unknown)   SpO2 98%   BMI 32.78 kg/m   Wt Readings from Last 3 Encounters:  04/27/18 200 lb (90.7 kg)  04/16/18 200 lb (90.7 kg)  03/23/18 204 lb 8 oz (92.8 kg)    Physical Exam Vitals signs and nursing note reviewed.  Constitutional:      Appearance: She is well-developed.  HENT:     Head: Normocephalic.  Eyes:     General:        Right eye: No discharge.        Left eye: No discharge.     Conjunctiva/sclera: Conjunctivae normal.     Pupils: Pupils are equal, round, and reactive to light.  Neck:     Musculoskeletal: Normal range of motion and neck supple.     Thyroid: No thyromegaly.     Vascular: No carotid bruit or JVD.  Cardiovascular:     Rate and Rhythm: Normal rate and regular rhythm.     Heart sounds: Normal heart sounds.  Pulmonary:     Effort: Pulmonary effort is normal.     Breath sounds: Normal breath sounds.  Abdominal:     General: Bowel sounds are normal.     Palpations: Abdomen is soft.  Lymphadenopathy:     Cervical: No cervical adenopathy.  Skin:    General: Skin is warm and dry.  Neurological:  Mental Status: She is alert and oriented to person, place, and time.  Psychiatric:        Mood and Affect: Mood normal.        Behavior: Behavior normal.        Thought Content: Thought content normal.        Judgment: Judgment normal.     Results for orders placed or performed during the hospital encounter of 04/16/18  Surgical pathology  Result Value Ref Range   SURGICAL PATHOLOGY      Surgical Pathology CASE: [id number] PATIENT: Mariel Craft Surgical Pathology Report     SPECIMEN SUBMITTED: A. Colon polyp, descending; cold snare/cbx B. Colon polyp x3, ascending; cold snare/cbx C. Colon polyp x2, cecum; cbx and cold snare  CLINICAL HISTORY: None provided  PRE-OPERATIVE DIAGNOSIS: History of  colon polyps  POST-OPERATIVE DIAGNOSIS: Colon polyps     DIAGNOSIS: A. COLON POLYP, DESCENDING; BIOPSY: - TUBULAR ADENOMA. - NEGATIVE FOR HIGH-GRADE DYSPLASIA AND MALIGNANCY.  B. COLON POLYP X3, ASCENDING; BIOPSY: - 1 TUBULAR ADENOMA, 1 SESSILE SERRATED POLYP AND A FRAGMENT OF BENIGN COLONIC MUCOSA. - NEGATIVE FOR HIGH-GRADE DYSPLASIA AND MALIGNANCY.  C. COLON POLYP X2, CECUM; BIOPSY: - TUBULAR ADENOMA (4 FRAGMENTS) AND FRAGMENTS OF BENIGN COLONIC MUCOSA. - NEGATIVE FOR HIGH-GRADE DYSPLASIA AND MALIGNANCY.   GROSS DESCRIPTION: A. Labeled: Descending colon polyp cold snare cbx Received: In formalin Tissue fragment(s): 2 Size: 0.4 and  0.5 cm Description: Tan-brown fragments Entirely submitted in 1 cassette.  B. Labeled: Ascending colon polyp x3 cold snare cbx Received: In formalin Tissue fragment(s): 3 Size: 0.3-0.5 cm Description: Pink-tan fragments Entirely submitted in 1 cassette.  C. Labeled: Cecum polyps x2 CBX and cold snare Received: In formalin Tissue fragment(s): Multiple Size: Aggregate, 1.1 x 0.3 x 0.1 cm Description: Tan-brown fragments Entirely submitted in 1 cassette.   Final Diagnosis performed by Raynelle Bring, MD.   Electronically signed 04/17/2018 9:09:41AM The electronic signature indicates that the named Attending Pathologist has evaluated the specimen  Technical component performed at [facility], [address], [place], [place] [address] Lab: [phone] Dir: Rush Farmer, MD, MMM  Professional component performed at [facility], [facility], [place], [place], [place] [address] Lab: [phone] Dir: Dellia Nims. Rubinas, MD       Assessment & Plan:   Problem List Items Addressed This Visit      Cardiovascular and Mediastinum   Hypertension - Primary    BP varies at home with frequent BP >130/90.  She is not interested in medication at this time.  Will continue focus on DASH diet, decrease soda intake, and exercise  (walking 30 minutes at a time 5 days a week).  Return in 2 months for reassessment.          Follow up plan: Return in about 2 months (around 06/28/2018) for HTN.

## 2018-04-29 ENCOUNTER — Encounter: Payer: Self-pay | Admitting: Gastroenterology

## 2018-04-30 ENCOUNTER — Telehealth: Payer: Self-pay | Admitting: Nurse Practitioner

## 2018-04-30 DIAGNOSIS — Z800 Family history of malignant neoplasm of digestive organs: Secondary | ICD-10-CM

## 2018-04-30 NOTE — Telephone Encounter (Signed)
Spoke to Ms. Swanner via telephone and reviewed suggestion from Dr. Vicente Males.  Patient had a colonoscopy noting several precancerous polyps.  Has family h/o colon cancer, with mother having colon cancer.  Dr. Vicente Males has suggested a referral to the [facility] for genetic testing.  Patient agrees with this plan of care.  Referral placed.

## 2018-06-28 ENCOUNTER — Ambulatory Visit: Payer: BLUE CROSS/BLUE SHIELD | Admitting: Nurse Practitioner

## 2018-07-02 ENCOUNTER — Ambulatory Visit: Payer: BLUE CROSS/BLUE SHIELD | Admitting: Nurse Practitioner

## 2018-07-02 ENCOUNTER — Encounter: Payer: Self-pay | Admitting: Nurse Practitioner

## 2018-07-02 ENCOUNTER — Other Ambulatory Visit: Payer: Self-pay

## 2018-07-02 VITALS — BP 126/72 | HR 76 | Temp 98.7°F | Ht 65.5 in | Wt 189.0 lb

## 2018-07-02 DIAGNOSIS — I10 Essential (primary) hypertension: Secondary | ICD-10-CM

## 2018-07-02 NOTE — Progress Notes (Signed)
BP 126/72   Pulse 76   Temp 98.7 F (37.1 C) (Oral)   Ht 5' 5.5" (1.664 m)   Wt 189 lb (85.7 kg)   LMP  (LMP Unknown)   SpO2 98%   BMI 30.97 kg/m    Subjective:    Patient ID: Maria Ball, female    DOB: 1960/09/28, 58 y.o.   MRN: [id number]  HPI: Maria Ball is a 58 y.o. female  Chief Complaint  Patient presents with  . Hypertension    f/u   HYPERTENSION: Pt reports currently managing HTN with diet and exercise. Pt eliminating meat, dairy, fats, and processed foods from her diet since January 6 with a weight loss of 11 pounds. Pt praised for her efforts to improve her health, recent weight loss, and blood pressure control. Pt educated on ensuring her nutrition needs are met with a limited diet- dietary information provided. She reports receiving nutrition counseling when starting the diet plan through her church and following recommendations. She states she currently has limited exercise, but plans to increase her exercise regimen as the weather improves and she can get out more to walk. Discussed the importance of diet and exercise for overall health.  Hypertension status: controlled  Satisfied with current treatment? yes Duration of hypertension: months BP monitoring frequency:  daily BP range: 120's-130's/ 70's-80's Previous BP meds:none Aspirin: no Recurrent headaches: no Visual changes: no Palpitations: no Dyspnea: no Chest pain: no Lower extremity edema: no Dizzy/lightheaded: no  Relevant past medical, surgical, family and social history reviewed and updated as indicated. Interim medical history since our last visit reviewed. Allergies and medications reviewed and updated.  Review of Systems  Per HPI unless specifically indicated above     Objective:    BP 126/72   Pulse 76   Temp 98.7 F (37.1 C) (Oral)   Ht 5' 5.5" (1.664 m)   Wt 189 lb (85.7 kg)   LMP  (LMP Unknown)   SpO2 98%   BMI 30.97 kg/m   Wt Readings from Last 3  Encounters:  07/02/18 189 lb (85.7 kg)  04/27/18 200 lb (90.7 kg)  04/16/18 200 lb (90.7 kg)    Physical Exam Vitals signs and nursing note reviewed.  Constitutional:      Appearance: She is well-developed.  HENT:     Head: Normocephalic.  Eyes:     General:        Right eye: No discharge.        Left eye: No discharge.     Conjunctiva/sclera: Conjunctivae normal.     Pupils: Pupils are equal, round, and reactive to light.  Neck:     Musculoskeletal: Normal range of motion and neck supple.     Thyroid: No thyromegaly.     Vascular: No carotid bruit or JVD.  Cardiovascular:     Rate and Rhythm: Normal rate and regular rhythm.     Heart sounds: Normal heart sounds.  Pulmonary:     Effort: Pulmonary effort is normal.     Breath sounds: Normal breath sounds.  Abdominal:     General: Bowel sounds are normal.     Palpations: Abdomen is soft.  Lymphadenopathy:     Cervical: No cervical adenopathy.  Skin:    General: Skin is warm and dry.  Neurological:     Mental Status: She is alert and oriented to person, place, and time.  Psychiatric:        Attention and Perception: Attention normal.  Mood and Affect: Mood normal.        Behavior: Behavior normal.        Thought Content: Thought content normal.        Judgment: Judgment normal.     Results for orders placed or performed during the hospital encounter of 04/16/18  Surgical pathology  Result Value Ref Range   SURGICAL PATHOLOGY      Surgical Pathology CASE: [id number] PATIENT: Mariel Craft Surgical Pathology Report     SPECIMEN SUBMITTED: A. Colon polyp, descending; cold snare/cbx B. Colon polyp x3, ascending; cold snare/cbx C. Colon polyp x2, cecum; cbx and cold snare  CLINICAL HISTORY: None provided  PRE-OPERATIVE DIAGNOSIS: History of colon polyps  POST-OPERATIVE DIAGNOSIS: Colon polyps     DIAGNOSIS: A. COLON POLYP, DESCENDING; BIOPSY: - TUBULAR ADENOMA. - NEGATIVE FOR HIGH-GRADE  DYSPLASIA AND MALIGNANCY.  B. COLON POLYP X3, ASCENDING; BIOPSY: - 1 TUBULAR ADENOMA, 1 SESSILE SERRATED POLYP AND A FRAGMENT OF BENIGN COLONIC MUCOSA. - NEGATIVE FOR HIGH-GRADE DYSPLASIA AND MALIGNANCY.  C. COLON POLYP X2, CECUM; BIOPSY: - TUBULAR ADENOMA (4 FRAGMENTS) AND FRAGMENTS OF BENIGN COLONIC MUCOSA. - NEGATIVE FOR HIGH-GRADE DYSPLASIA AND MALIGNANCY.   GROSS DESCRIPTION: A. Labeled: Descending colon polyp cold snare cbx Received: In formalin Tissue fragment(s): 2 Size: 0.4 and  0.5 cm Description: Tan-brown fragments Entirely submitted in 1 cassette.  B. Labeled: Ascending colon polyp x3 cold snare cbx Received: In formalin Tissue fragment(s): 3 Size: 0.3-0.5 cm Description: Pink-tan fragments Entirely submitted in 1 cassette.  C. Labeled: Cecum polyps x2 CBX and cold snare Received: In formalin Tissue fragment(s): Multiple Size: Aggregate, 1.1 x 0.3 x 0.1 cm Description: Tan-brown fragments Entirely submitted in 1 cassette.   Final Diagnosis performed by Raynelle Bring, MD.   Electronically signed 04/17/2018 9:09:41AM The electronic signature indicates that the named Attending Pathologist has evaluated the specimen  Technical component performed at [facility], [address], [place], [place] [address] Lab: [phone] Dir: Rush Farmer, MD, MMM  Professional component performed at [facility], [facility], [place], [place], [place] [address] Lab: [phone] Dir: Dellia Nims. Reuel Derby, MD       Assessment & Plan:   Problem List Items Addressed This Visit      Cardiovascular and Mediastinum   Hypertension - Primary    Improved. Continue diet and exercise management with daily monitoring of BP. No medications at this time.  Follow-up in appx 6 months.           Follow up plan: Return in about 6 months (around 12/31/2018) for HTN.

## 2018-07-02 NOTE — Patient Instructions (Addendum)
DASH Eating Plan DASH stands for "Dietary Approaches to Stop Hypertension." The DASH eating plan is a healthy eating plan that has been shown to reduce high blood pressure (hypertension). It may also reduce your risk for type 2 diabetes, heart disease, and stroke. The DASH eating plan may also help with weight loss. What are tips for following this plan?  General guidelines  Avoid eating more than 2,300 mg (milligrams) of salt (sodium) a day. If you have hypertension, you may need to reduce your sodium intake to 1,500 mg a day.  Limit alcohol intake to no more than 1 drink a day for nonpregnant women and 2 drinks a day for men. One drink equals 12 oz of beer, 5 oz of wine, or 1 oz of hard liquor.  Work with your health care provider to maintain a healthy body weight or to lose weight. Ask what an ideal weight is for you.  Get at least 30 minutes of exercise that causes your heart to beat faster (aerobic exercise) most days of the week. Activities may include walking, swimming, or biking.  Work with your health care provider or diet and nutrition specialist (dietitian) to adjust your eating plan to your individual calorie needs. Reading food labels   Check food labels for the amount of sodium per serving. Choose foods with less than 5 percent of the Daily Value of sodium. Generally, foods with less than 300 mg of sodium per serving fit into this eating plan.  To find whole grains, look for the word "whole" as the first word in the ingredient list. Shopping  Buy products labeled as "low-sodium" or "no salt added."  Buy fresh foods. Avoid canned foods and premade or frozen meals. Cooking  Avoid adding salt when cooking. Use salt-free seasonings or herbs instead of table salt or sea salt. Check with your health care provider or pharmacist before using salt substitutes.  Do not fry foods. Cook foods using healthy methods such as baking, boiling, grilling, and broiling instead.  Cook with  heart-healthy oils, such as olive, canola, soybean, or sunflower oil. Meal planning  Eat a balanced diet that includes: ? 5 or more servings of fruits and vegetables each day. At each meal, try to fill half of your plate with fruits and vegetables. ? Up to 6-8 servings of whole grains each day. ? Less than 6 oz of lean meat, poultry, or fish each day. A 3-oz serving of meat is about the same size as a deck of cards. One egg equals 1 oz. ? 2 servings of low-fat dairy each day. ? A serving of nuts, seeds, or beans 5 times each week. ? Heart-healthy fats. Healthy fats called Omega-3 fatty acids are found in foods such as flaxseeds and coldwater fish, like sardines, salmon, and mackerel.  Limit how much you eat of the following: ? Canned or prepackaged foods. ? Food that is high in trans fat, such as fried foods. ? Food that is high in saturated fat, such as fatty meat. ? Sweets, desserts, sugary drinks, and other foods with added sugar. ? Full-fat dairy products.  Do not salt foods before eating.  Try to eat at least 2 vegetarian meals each week.  Eat more home-cooked food and less restaurant, buffet, and fast food.  When eating at a restaurant, ask that your food be prepared with less salt or no salt, if possible. What foods are recommended? The items listed may not be a complete list. Talk with your dietitian about   what dietary choices are best for you. Grains Whole-grain or whole-wheat bread. Whole-grain or whole-wheat pasta. Brown rice. Oatmeal. Quinoa. Bulgur. Whole-grain and low-sodium cereals. Pita bread. Low-fat, low-sodium crackers. Whole-wheat flour tortillas. Vegetables Fresh or frozen vegetables (raw, steamed, roasted, or grilled). Low-sodium or reduced-sodium tomato and vegetable juice. Low-sodium or reduced-sodium tomato sauce and tomato paste. Low-sodium or reduced-sodium canned vegetables. Fruits All fresh, dried, or frozen fruit. Canned fruit in natural juice (without  added sugar). Meat and other protein foods Skinless chicken or turkey. Ground chicken or turkey. Pork with fat trimmed off. Fish and seafood. Egg whites. Dried beans, peas, or lentils. Unsalted nuts, nut butters, and seeds. Unsalted canned beans. Lean cuts of beef with fat trimmed off. Low-sodium, lean deli meat. Dairy Low-fat (1%) or fat-free (skim) milk. Fat-free, low-fat, or reduced-fat cheeses. Nonfat, low-sodium ricotta or cottage cheese. Low-fat or nonfat yogurt. Low-fat, low-sodium cheese. Fats and oils Soft margarine without trans fats. Vegetable oil. Low-fat, reduced-fat, or light mayonnaise and salad dressings (reduced-sodium). Canola, safflower, olive, soybean, and sunflower oils. Avocado. Seasoning and other foods Herbs. Spices. Seasoning mixes without salt. Unsalted popcorn and pretzels. Fat-free sweets. What foods are not recommended? The items listed may not be a complete list. Talk with your dietitian about what dietary choices are best for you. Grains Baked goods made with fat, such as croissants, muffins, or some breads. Dry pasta or rice meal packs. Vegetables Creamed or fried vegetables. Vegetables in a cheese sauce. Regular canned vegetables (not low-sodium or reduced-sodium). Regular canned tomato sauce and paste (not low-sodium or reduced-sodium). Regular tomato and vegetable juice (not low-sodium or reduced-sodium). Pickles. Olives. Fruits Canned fruit in a light or heavy syrup. Fried fruit. Fruit in cream or butter sauce. Meat and other protein foods Fatty cuts of meat. Ribs. Fried meat. Bacon. Sausage. Bologna and other processed lunch meats. Salami. Fatback. Hotdogs. Bratwurst. Salted nuts and seeds. Canned beans with added salt. Canned or smoked fish. Whole eggs or egg yolks. Chicken or turkey with skin. Dairy Whole or 2% milk, cream, and half-and-half. Whole or full-fat cream cheese. Whole-fat or sweetened yogurt. Full-fat cheese. Nondairy creamers. Whipped toppings.  Processed cheese and cheese spreads. Fats and oils Butter. Stick margarine. Lard. Shortening. Ghee. Bacon fat. Tropical oils, such as coconut, palm kernel, or palm oil. Seasoning and other foods Salted popcorn and pretzels. Onion salt, garlic salt, seasoned salt, table salt, and sea salt. Worcestershire sauce. Tartar sauce. Barbecue sauce. Teriyaki sauce. Soy sauce, including reduced-sodium. Steak sauce. Canned and packaged gravies. Fish sauce. Oyster sauce. Cocktail sauce. Horseradish that you find on the shelf. Ketchup. Mustard. Meat flavorings and tenderizers. Bouillon cubes. Hot sauce and Tabasco sauce. Premade or packaged marinades. Premade or packaged taco seasonings. Relishes. Regular salad dressings. Where to find more information:  National Heart, Lung, and Blood Institute: www.nhlbi.nih.gov  American Heart Association: www.heart.org Summary  The DASH eating plan is a healthy eating plan that has been shown to reduce high blood pressure (hypertension). It may also reduce your risk for type 2 diabetes, heart disease, and stroke.  With the DASH eating plan, you should limit salt (sodium) intake to 2,300 mg a day. If you have hypertension, you may need to reduce your sodium intake to 1,500 mg a day.  When on the DASH eating plan, aim to eat more fresh fruits and vegetables, whole grains, lean proteins, low-fat dairy, and heart-healthy fats.  Work with your health care provider or diet and nutrition specialist (dietitian) to adjust your eating plan to your   individual calorie needs. This information is not intended to replace advice given to you by your health care provider. Make sure you discuss any questions you have with your health care provider. Document Released: 04/14/2011 Document Revised: 04/18/2016 Document Reviewed: 04/18/2016 Elsevier Interactive Patient Education  2019 [place] Content in Foods  Generally, most healthy people need around 50 grams of protein  each day. Depending on your overall health, you may need more or less protein in your diet. Talk to your health care provider or dietitian about how much protein you need. See the following list for the protein content of some common foods. High-protein foods High-protein foods contain 4 grams (4 g) or more of protein per serving. They include:  Beef, ground sirloin (cooked) - 3 oz have 24 g of protein.  Cheese (hard) - 1 oz has 7 g of protein.  Chicken breast, boneless and skinless (cooked) - 3 oz have 13.4 g of protein.  Cottage cheese - 1/2 cup has 13.4 g of protein.  Egg - 1 egg has 6 g of protein.  Fish, filet (cooked) - 1 oz has 6-7 g of protein.  Garbanzo beans (canned or cooked) - 1/2 cup has 6-7 g of protein.  Kidney beans (canned or cooked) - 1/2 cup has 6-7 g of protein.  Lamb (cooked) - 3 oz has 24 g of protein.  Milk - 1 cup (8 oz) has 8 g of protein.  Nuts (peanuts, pistachios, almonds) - 1 oz has 6 g of protein.  Peanut butter - 1 oz has 7-8 g of protein.  Pork tenderloin (cooked) - 3 oz has 18.4 g of protein.  Pumpkin seeds - 1 oz has 8.5 g of protein.  Soybeans (roasted) - 1 oz has 8 g of protein.  Soybeans (cooked) - 1/2 cup has 11 g of protein.  Soy milk - 1 cup (8 oz) has 5-10 g of protein.  Soy or vegetable patty - 1 patty has 11 g of protein.  Sunflower seeds - 1 oz has 5.5 g of protein.  Tofu (firm) - 1/2 cup has 20 g of protein.  Tuna (canned in water) - 3 oz has 20 g of protein.  Yogurt - 6 oz has 8 g of protein. Low-protein foods Low-protein foods contain 3 grams (3 g) or less of protein per serving. They include:  Beets (raw or cooked) - 1/2 cup has 1.5 g of protein.  Bran cereal - 1/2 cup has 2-3 g of protein.  Bread - 1 slice has 2.5 g of protein.  Broccoli (raw or cooked) - 1/2 cup has 2 g of protein.  Collard greens (raw or cooked) - 1/2 cup has 2 g of protein.  Corn (fresh or cooked) - 1/2 cup has 2 g of protein.  Cream  cheese - 1 oz has 2 g of protein.  Creamer (half-and-half) - 1 oz has 1 g of protein.  Flour tortilla - 1 tortilla has 2.5 g of protein  Frozen yogurt - 1/2 cup has 3 g of protein.  Fruit or vegetable juice - 1/2 cup has 1 g of protein.  Green beans (raw or cooked) - 1/2 cup has 1 g of protein.  Green peas (canned) - 1/2 cup has 3.5 g of protein.  Muffins - 1 small muffin (2 oz) has 3 g of protein.  Oatmeal (cooked) - 1/2 cup has 3 g of protein.  Potato (baked with skin) - 1 medium potato has 3  g of protein.  Rice (cooked) - 1/2 cup has 2.5-3.5 g of protein.  Sour cream - 1/2 cup has 2.5 g of protein.  Spinach (cooked) - 1/2 cup has 3 g of protein.  Squash (cooked) - 1/2 cup has 1.5 g of protein. Actual amounts of protein may be different depending on processing. Talk with your health care provider or dietitian about what foods are recommended for you. This information is not intended to replace advice given to you by your health care provider. Make sure you discuss any questions you have with your health care provider. Document Released: 07/25/2015 Document Revised: 01/04/2016 Document Reviewed: 01/04/2016 Elsevier Interactive Patient Education  2019 [employer].

## 2018-07-02 NOTE — Assessment & Plan Note (Addendum)
Improved. Continue diet and exercise management with daily monitoring of BP. No medications at this time.  Follow-up in appx 6 months.

## 2018-12-31 ENCOUNTER — Ambulatory Visit: Payer: BLUE CROSS/BLUE SHIELD | Admitting: Nurse Practitioner

## 2019-01-01 ENCOUNTER — Encounter: Payer: Self-pay | Admitting: Nurse Practitioner

## 2019-01-01 ENCOUNTER — Ambulatory Visit: Payer: BC Managed Care – PPO | Admitting: Nurse Practitioner

## 2019-01-01 ENCOUNTER — Other Ambulatory Visit: Payer: Self-pay

## 2019-01-01 DIAGNOSIS — I10 Essential (primary) hypertension: Secondary | ICD-10-CM

## 2019-01-01 NOTE — Progress Notes (Signed)
BP 132/77   Pulse 87   Temp 98.8 F (37.1 C) (Oral)   Wt 186 lb 9.6 oz (84.6 kg)   LMP  (LMP Unknown)   SpO2 97%   BMI 30.58 kg/m    Subjective:    Patient ID: Maria Ball, female    DOB: 1960-11-25, 58 y.o.   MRN: [id number]  HPI: Maria Ball is a 58 y.o. female  Chief Complaint  Patient presents with  . Hypertension   HYPERTENSION Continues to focus on diet. Hypertension status: controlled  Satisfied with current treatment? no Duration of hypertension: chronic BP monitoring frequency:  a few times a week BP range: 118 -135/70-80 BP medication side effects:  no Medication compliance: good compliance Aspirin: no Recurrent headaches: no Visual changes: no Palpitations: no Dyspnea: no Chest pain: no Lower extremity edema: no Dizzy/lightheaded: no  Relevant past medical, surgical, family and social history reviewed and updated as indicated. Interim medical history since our last visit reviewed. Allergies and medications reviewed and updated.  Review of Systems  Constitutional: Negative for activity change, appetite change, diaphoresis, fatigue and fever.  Respiratory: Negative for cough, chest tightness and shortness of breath.   Cardiovascular: Negative for chest pain, palpitations and leg swelling.  Gastrointestinal: Negative for abdominal distention, abdominal pain, constipation, diarrhea, nausea and vomiting.  Neurological: Negative for dizziness, syncope, weakness, light-headedness, numbness and headaches.  Psychiatric/Behavioral: Negative.     Per HPI unless specifically indicated above     Objective:    BP 132/77   Pulse 87   Temp 98.8 F (37.1 C) (Oral)   Wt 186 lb 9.6 oz (84.6 kg)   LMP  (LMP Unknown)   SpO2 97%   BMI 30.58 kg/m   Wt Readings from Last 3 Encounters:  01/01/19 186 lb 9.6 oz (84.6 kg)  07/02/18 189 lb (85.7 kg)  04/27/18 200 lb (90.7 kg)    Physical Exam Vitals signs and nursing note reviewed.   Constitutional:      General: She is awake. She is not in acute distress.    Appearance: She is well-developed. She is not ill-appearing.  HENT:     Head: Normocephalic.     Right Ear: Hearing normal.     Left Ear: Hearing normal.  Eyes:     General: Lids are normal.        Right eye: No discharge.        Left eye: No discharge.     Conjunctiva/sclera: Conjunctivae normal.     Pupils: Pupils are equal, round, and reactive to light.  Neck:     Musculoskeletal: Normal range of motion and neck supple.     Thyroid: No thyromegaly.     Vascular: No carotid bruit.  Cardiovascular:     Rate and Rhythm: Normal rate and regular rhythm.     Heart sounds: Normal heart sounds. No murmur. No gallop.   Pulmonary:     Effort: Pulmonary effort is normal. No accessory muscle usage or respiratory distress.     Breath sounds: Normal breath sounds.  Abdominal:     General: Bowel sounds are normal.     Palpations: Abdomen is soft.  Musculoskeletal:     Right lower leg: No edema.     Left lower leg: No edema.  Lymphadenopathy:     Cervical: No cervical adenopathy.  Skin:    General: Skin is warm and dry.  Neurological:     Mental Status: She is alert and oriented to person,  place, and time.  Psychiatric:        Attention and Perception: Attention normal.        Mood and Affect: Mood normal.        Behavior: Behavior normal. Behavior is cooperative.        Thought Content: Thought content normal.        Judgment: Judgment normal.    Results for orders placed or performed during the hospital encounter of 04/16/18  Surgical pathology  Result Value Ref Range   SURGICAL PATHOLOGY      Surgical Pathology CASE: [id number] PATIENT: Mariel Craft Surgical Pathology Report     SPECIMEN SUBMITTED: A. Colon polyp, descending; cold snare/cbx B. Colon polyp x3, ascending; cold snare/cbx C. Colon polyp x2, cecum; cbx and cold snare  CLINICAL HISTORY: None provided  PRE-OPERATIVE  DIAGNOSIS: History of colon polyps  POST-OPERATIVE DIAGNOSIS: Colon polyps     DIAGNOSIS: A. COLON POLYP, DESCENDING; BIOPSY: - TUBULAR ADENOMA. - NEGATIVE FOR HIGH-GRADE DYSPLASIA AND MALIGNANCY.  B. COLON POLYP X3, ASCENDING; BIOPSY: - 1 TUBULAR ADENOMA, 1 SESSILE SERRATED POLYP AND A FRAGMENT OF BENIGN COLONIC MUCOSA. - NEGATIVE FOR HIGH-GRADE DYSPLASIA AND MALIGNANCY.  C. COLON POLYP X2, CECUM; BIOPSY: - TUBULAR ADENOMA (4 FRAGMENTS) AND FRAGMENTS OF BENIGN COLONIC MUCOSA. - NEGATIVE FOR HIGH-GRADE DYSPLASIA AND MALIGNANCY.   GROSS DESCRIPTION: A. Labeled: Descending colon polyp cold snare cbx Received: In formalin Tissue fragment(s): 2 Size: 0.4 and  0.5 cm Description: Tan-brown fragments Entirely submitted in 1 cassette.  B. Labeled: Ascending colon polyp x3 cold snare cbx Received: In formalin Tissue fragment(s): 3 Size: 0.3-0.5 cm Description: Pink-tan fragments Entirely submitted in 1 cassette.  C. Labeled: Cecum polyps x2 CBX and cold snare Received: In formalin Tissue fragment(s): Multiple Size: Aggregate, 1.1 x 0.3 x 0.1 cm Description: Tan-brown fragments Entirely submitted in 1 cassette.   Final Diagnosis performed by Raynelle Bring, MD.   Electronically signed 04/17/2018 9:09:41AM The electronic signature indicates that the named Attending Pathologist has evaluated the specimen  Technical component performed at [facility], [address], [place], [place] [address] Lab: [phone] Dir: Rush Farmer, MD, MMM  Professional component performed at [facility], [facility], [place], [place], [place] [address] Lab: [phone] Dir: Dellia Nims. Rubinas, MD       Assessment & Plan:   Problem List Items Addressed This Visit      Cardiovascular and Mediastinum   Hypertension    Chronic, stable with good control on diet alone.  Continue diet and exercise focus, praised for continued control.          Follow up plan: Return  for as scheduled, physical November.

## 2019-01-01 NOTE — Patient Instructions (Signed)

## 2019-01-01 NOTE — Assessment & Plan Note (Signed)
Chronic, stable with good control on diet alone.  Continue diet and exercise focus, praised for continued control.

## 2019-02-10 ENCOUNTER — Encounter: Payer: Self-pay | Admitting: Emergency Medicine

## 2019-02-10 ENCOUNTER — Encounter: Payer: Self-pay | Admitting: Nurse Practitioner

## 2019-02-10 ENCOUNTER — Emergency Department: Payer: BLUE CROSS/BLUE SHIELD

## 2019-02-10 ENCOUNTER — Emergency Department
Admission: EM | Admit: 2019-02-10 | Discharge: 2019-02-10 | Disposition: A | Discharge status: Home / Self Care | Payer: BLUE CROSS/BLUE SHIELD | Attending: Emergency Medicine | Admitting: Emergency Medicine

## 2019-02-10 ENCOUNTER — Other Ambulatory Visit: Payer: Self-pay

## 2019-02-10 DIAGNOSIS — I10 Essential (primary) hypertension: Secondary | ICD-10-CM | POA: Insufficient documentation

## 2019-02-10 DIAGNOSIS — R1011 Right upper quadrant pain: Secondary | ICD-10-CM | POA: Insufficient documentation

## 2019-02-10 DIAGNOSIS — M5136 Other intervertebral disc degeneration, lumbar region: Secondary | ICD-10-CM | POA: Insufficient documentation

## 2019-02-10 DIAGNOSIS — R1084 Generalized abdominal pain: Secondary | ICD-10-CM | POA: Diagnosis not present

## 2019-02-10 LAB — CBC
HCT: 44 % (ref 36.0–46.0)
Hemoglobin: 14.5 g/dL (ref 12.0–15.0)
MCH: 27.2 pg (ref 26.0–34.0)
MCHC: 33 g/dL (ref 30.0–36.0)
MCV: 82.6 fL (ref 80.0–100.0)
Platelets: 380 10*3/uL (ref 150–400)
RBC: 5.33 MIL/uL — ABNORMAL HIGH (ref 3.87–5.11)
RDW: 12.9 % (ref 11.5–15.5)
WBC: 8.4 10*3/uL (ref 4.0–10.5)
nRBC: 0 % (ref 0.0–0.2)

## 2019-02-10 LAB — COMPREHENSIVE METABOLIC PANEL
ALT: 20 U/L (ref 0–44)
AST: 28 U/L (ref 15–41)
Albumin: 4.5 g/dL (ref 3.5–5.0)
Alkaline Phosphatase: 146 U/L — ABNORMAL HIGH (ref 38–126)
Anion gap: 10 (ref 5–15)
BUN: 12 mg/dL (ref 6–20)
CO2: 23 mmol/L (ref 22–32)
Calcium: 9.3 mg/dL (ref 8.9–10.3)
Chloride: 103 mmol/L (ref 98–111)
Creatinine, Ser: 0.69 mg/dL (ref 0.44–1.00)
GFR calc Af Amer: >60 mL/min (ref >60)
GFR calc non Af Amer: >60 mL/min (ref >60)
Glucose, Bld: 115 mg/dL — ABNORMAL HIGH (ref 70–99)
Potassium: 4.3 mmol/L (ref 3.5–5.1)
Sodium: 136 mmol/L (ref 135–145)
Total Bilirubin: 0.9 mg/dL (ref 0.3–1.2)
Total Protein: 7.9 g/dL (ref 6.5–8.1)

## 2019-02-10 LAB — LIPASE, BLOOD: Lipase: 35 U/L (ref 11–51)

## 2019-02-10 MED ORDER — MORPHINE SULFATE (PF) 2 MG/ML IV SOLN
2.0000 mg | Freq: Once | INTRAVENOUS | Status: AC
  Administered 2019-02-10: 2 mg via INTRAVENOUS

## 2019-02-10 MED ORDER — IOHEXOL 300 MG/ML  SOLN
100.0000 mL | Freq: Once | INTRAMUSCULAR | Status: AC | PRN
  Administered 2019-02-10: 100 mL via INTRAVENOUS

## 2019-02-10 MED ORDER — ONDANSETRON HCL 4 MG/2ML IJ SOLN
INTRAMUSCULAR | Status: AC
  Filled 2019-02-10: qty 2

## 2019-02-10 MED ORDER — LACTULOSE 10 GM/15ML PO SOLN
20.0000 g | Freq: Once | ORAL | Status: AC
  Administered 2019-02-10: 20 g via ORAL
  Filled 2019-02-10: qty 30

## 2019-02-10 MED ORDER — ONDANSETRON HCL 4 MG/2ML IJ SOLN
4.0000 mg | Freq: Once | INTRAMUSCULAR | Status: AC
  Administered 2019-02-10: 4 mg via INTRAVENOUS

## 2019-02-10 MED ORDER — SODIUM CHLORIDE 0.9% FLUSH: 3.0000 mL | Freq: Once | INTRAVENOUS | Status: DC

## 2019-02-10 MED ORDER — MORPHINE SULFATE (PF) 2 MG/ML IV SOLN
INTRAVENOUS | Status: AC
  Filled 2019-02-10: qty 1

## 2019-02-10 NOTE — ED Notes (Signed)
Report to kim, rn.  

## 2019-02-10 NOTE — ED Provider Notes (Signed)
[facility] Emergency Department Provider Note  ____________________________________________   First MD Initiated Contact with Patient 02/10/19 [phone]     (approximate)  I have reviewed the triage vital signs and the nursing notes.   HISTORY  Chief Complaint Abdominal Pain   HPI Maria Ball is a 57 y.o. female with below list of previous medical conditions presents to the emergency department secondary to acute onset of right upper quadrant pain with associated nausea tonight.  Patient also states that she had a similar episode on Friday that spontaneously resolved.  Patient denies any fever no urinary symptoms no vomiting.  Patient denies any known history of gallbladder disease.  Patient however does admit that her mother had her gallbladder removed.        Past Medical History:  Diagnosis Date  . Alkaline phosphatase elevation   . Depression   . Depression 12/11/2014  . Hypertension     Patient Active Problem List   Diagnosis Date Noted  . DDD (degenerative disc disease), lumbar 02/10/2019  . Obesity 12/12/2014  . Hypertension 12/11/2014  . Alkaline phosphatase raised 12/11/2014    Past Surgical History:  Procedure Laterality Date  . COLONOSCOPY WITH PROPOFOL N/A 04/16/2018   Procedure: COLONOSCOPY WITH PROPOFOL;  Surgeon: Jonathon Bellows, MD;  Location: [facility] ENDOSCOPY;  Service: Gastroenterology;  Laterality: N/A;  . DILATION AND CURETTAGE OF UTERUS    . TONSILLECTOMY      Prior to Admission medications   Not on File    Allergies Patient has no known allergies.  Family History  Problem Relation Age of Onset  . Cancer Mother        colon-rectal  . Emphysema Father   . Cancer Sister        breast  . Hypertension Sister   . Diabetes Sister   . Breast cancer Sister 84  . Stroke Sister   . Hypertension Brother     Social History Social History   Tobacco Use  . Smoking status: Never Smoker  . Smokeless tobacco: Never  Used  Substance Use Topics  . Alcohol use: No    Alcohol/week: 0.0 standard drinks  . Drug use: No    Review of Systems Constitutional: No fever/chills Eyes: No visual changes. ENT: No sore throat. Cardiovascular: Denies chest pain. Respiratory: Denies shortness of breath. Gastrointestinal: Positive for abdominal pain and  nausea, no vomiting.  No diarrhea.  No constipation. Genitourinary: Negative for dysuria. Musculoskeletal: Negative for neck pain.  Negative for back pain. Integumentary: Negative for rash. Neurological: Negative for headaches, focal weakness or numbness.  ____________________________________________   PHYSICAL EXAM:  VITAL SIGNS: ED Triage Vitals  Enc Vitals Group     BP 02/10/19 0412 (!) 155/75     Pulse Rate 02/10/19 0412 (!) 102     Resp 02/10/19 0412 16     Temp 02/10/19 0412 98.8 F (37.1 C)     Temp Source 02/10/19 0412 Oral     SpO2 02/10/19 0412 97 %     Weight 02/10/19 0414 83.9 kg (185 lb)     Height 02/10/19 0414 1.676 m (5\' 6" )     Head Circumference --      Peak Flow --      Pain Score 02/10/19 0413 8     Pain Loc --      Pain Edu? --      Excl. in [place]? --     Constitutional: Alert and oriented.  Eyes: Conjunctivae are normal.  Mouth/Throat: Mucous membranes are moist. Neck: No stridor.  No meningeal signs.   Cardiovascular: Normal rate, regular rhythm. Good peripheral circulation. Grossly normal heart sounds. Respiratory: Normal respiratory effort.  No retractions. Gastrointestinal: Right upper quadrant tenderness to palpation.  No distention.  Musculoskeletal: No lower extremity tenderness nor edema. No gross deformities of extremities. Neurologic:  Normal speech and language. No gross focal neurologic deficits are appreciated.  Skin:  Skin is warm, dry and intact. Psychiatric: Mood and affect are normal. Speech and behavior are normal.  ____________________________________________   LABS (all labs ordered are listed, but  only abnormal results are displayed)  Labs Reviewed  COMPREHENSIVE METABOLIC PANEL - Abnormal; Notable for the following components:      Result Value   Glucose, Bld 115 (*)    Alkaline Phosphatase 146 (*)    All other components within normal limits  CBC - Abnormal; Notable for the following components:   RBC 5.33 (*)    All other components within normal limits  LIPASE, BLOOD   ____________________________________________  EKG  ED ECG REPORT I, Watson N Lebaron Bautch, the attending physician, personally viewed and interpreted this ECG.   Date: 02/10/2019  EKG Time: 4:23 AM  Rate: 77  Rhythm: Normal sinus rhythm  Axis: Normal  Intervals: Normal  ST&T Change: None  ____________________________________________  RADIOLOGY I, Gypsy N Bindu Docter, personally viewed and evaluated these images (plain radiographs) as part of my medical decision making, as well as reviewing the written report by the radiologist.  ED MD interpretation: Right upper quadrant ultrasound CT abdomen pelvis revealed no acute intra-abdominal pathology large stool burden noted  Official radiology report(s): No results found.    Procedures   ____________________________________________   INITIAL IMPRESSION / MDM / ASSESSMENT AND PLAN / ED COURSE  As part of my medical decision making, I reviewed the following data within the electronic MEDICAL RECORD NUMBER  58 year old female present with above-stated history and physical exam secondary to right upper quadrant abdominal pain presenting concern for possible cholelithiasis versus cholecystitis less likely diverticulitis.  As such right upper quadrant ultrasound performed revealed no acute pathology.  CT abdomen pelvis likewise performed which revealed large stool burden however no other gross pathology.  Patient given lactulose in the emergency department      Clinical Course as of Feb 12 2334  Sun Feb 10, 2019  0649 Creatinine: 0.69 [RB]    Clinical Course  User Index [RB] Gregor Hams, MD     ____________________________________________  FINAL CLINICAL IMPRESSION(S) / ED DIAGNOSES  Final diagnoses:  RUQ pain     MEDICATIONS GIVEN DURING THIS VISIT:  Medications  morphine 2 MG/ML injection 2 mg (2 mg Intravenous Given 02/10/19 0446)  ondansetron (ZOFRAN) injection 4 mg (4 mg Intravenous Given 02/10/19 0447)  iohexol (OMNIPAQUE) 300 MG/ML solution 100 mL (100 mLs Intravenous Contrast Given 02/10/19 0618)  lactulose (CHRONULAC) 10 GM/15ML solution 20 g (20 g Oral Given 02/10/19 [id number])     ED Discharge Orders    None      *Please note:  Maria Ball was evaluated in Emergency Department on 02/13/2019 for the symptoms described in the history of present illness. She was evaluated in the context of the global COVID-19 pandemic, which necessitated consideration that the patient might be at risk for infection with the SARS-CoV-2 virus that causes COVID-19. Institutional protocols and algorithms that pertain to the evaluation of patients at risk for COVID-19 are in a state of rapid change based on information released by  regulatory bodies including the CDC and federal and state organizations. These policies and algorithms were followed during the patient's care in the ED.  Some ED evaluations and interventions may be delayed as a result of limited staffing during the pandemic.*  Note:  This document was prepared using Dragon voice recognition software and may include unintentional dictation errors.   Gregor Hams, MD 02/13/19 [phone]

## 2019-02-10 NOTE — ED Triage Notes (Signed)
Pt reports right upper quadrant pain that woke her from sleep Friday night and again tonight; says the area felt sore when she woke Saturday morning; pain is sharp at times, like a spasm others; nausea, no vomiting; denies diarrhea; denies fever at home; pt ambulatory with steady gait; talking in complete coherent sentences

## 2019-02-10 NOTE — ED Notes (Signed)
Pt verbalized understanding of discharge instructions. NAD at this time. 

## 2019-02-11 NOTE — ED Notes (Signed)
Patient called me because she has not had bowel movement since taking the latulose 24 hours ago.  She is afraid to take another laxative and her pcp is unable to tell her what to take.  Says she did make appt for follow up.  Per dr Corky Downs she can take another laxative. I told her that she can ask pharmacist or choose otc laxative.

## 2019-02-15 DIAGNOSIS — B029 Zoster without complications: Secondary | ICD-10-CM | POA: Diagnosis not present

## 2019-02-18 ENCOUNTER — Telehealth: Payer: Self-pay | Admitting: Nurse Practitioner

## 2019-02-18 ENCOUNTER — Ambulatory Visit: Payer: BC Managed Care – PPO | Admitting: Family Medicine

## 2019-02-18 NOTE — Telephone Encounter (Signed)
Patient called and would like to talk to [facility] CMA about a couple of thing about her ER f/u. Please call patient back, thanks.

## 2019-02-18 NOTE — Telephone Encounter (Signed)
Called and scheduled patient for tomorrow at 10:45 with Dr. Wynetta Emery as patient wanted first available appointment. Will route to Dr. Wynetta Emery so she is aware of the situation.

## 2019-02-18 NOTE — Telephone Encounter (Signed)
Noted  

## 2019-02-18 NOTE — Telephone Encounter (Signed)
Since I am not in office this week, please see if another provider is okay with seeing her or add to Cheryl's schedule Thursday afternoon for further evaluation.  Thank you.

## 2019-02-18 NOTE — Telephone Encounter (Signed)
Called and spoke to patient. She states that she is still having RUQ pain since her ER visit on 02/10/19. Patient states that the provider at the ER told her that she just needed to have a BM and should feel better after that. Patient states she was awaken with the same pain in the same area at about 12:30 this morning. Patient states she had a CT and US done at the ER already and doesn't know what should be done next. Patient wanted to let Jolene know and she what she suggested.

## 2019-02-19 ENCOUNTER — Ambulatory Visit (INDEPENDENT_AMBULATORY_CARE_PROVIDER_SITE_OTHER): Payer: BC Managed Care – PPO | Admitting: Family Medicine

## 2019-02-19 ENCOUNTER — Encounter: Payer: Self-pay | Admitting: Family Medicine

## 2019-02-19 ENCOUNTER — Other Ambulatory Visit: Payer: Self-pay

## 2019-02-19 VITALS — BP 126/64 | HR 90 | Temp 98.5°F | Ht 66.0 in | Wt 180.0 lb

## 2019-02-19 DIAGNOSIS — Z23 Encounter for immunization: Secondary | ICD-10-CM | POA: Diagnosis not present

## 2019-02-19 DIAGNOSIS — R1011 Right upper quadrant pain: Secondary | ICD-10-CM

## 2019-02-19 LAB — CBC WITH DIFFERENTIAL/PLATELET
Hematocrit: 39.7 % (ref 34.0–46.6)
Hemoglobin: 13.6 g/dL (ref 11.1–15.9)
Lymphocytes Absolute: 2.6 10*3/uL (ref 0.7–3.1)
Lymphs: 42 %
MCH: 28.9 pg (ref 26.6–33.0)
MCHC: 34.3 g/dL (ref 31.5–35.7)
MCV: 85 fL (ref 79–97)
MID (Absolute): 0.5 10*3/uL (ref 0.1–1.6)
MID: 8 %
Neutrophils Absolute: 3.2 10*3/uL (ref 1.4–7.0)
Neutrophils: 50 %
Platelets: 370 10*3/uL (ref 150–450)
RBC: 4.7 x10E6/uL (ref 3.77–5.28)
RDW: 14 % (ref 11.7–15.4)
WBC: 6.3 10*3/uL (ref 3.4–10.8)

## 2019-02-19 MED ORDER — SUCRALFATE 1 G PO TABS: 1.0000 g | ORAL_TABLET | Freq: Three times a day (TID) | ORAL | 2 refills | Status: DC

## 2019-02-19 MED ORDER — OMEPRAZOLE 40 MG PO CPDR: 40.0000 mg | DELAYED_RELEASE_CAPSULE | Freq: Every day | ORAL | 3 refills | Status: DC

## 2019-02-19 NOTE — Progress Notes (Signed)
BP 126/64   Pulse 90   Temp 98.5 F (36.9 C) (Oral)   Ht 5\' 6"  (1.676 m)   Wt 180 lb (81.6 kg)   LMP  (LMP Unknown)   SpO2 97%   BMI 29.05 kg/m    Subjective:    Patient ID: Maria Ball, female    DOB: Mar 19, 1961, 58 y.o.   MRN: [id number]  HPI: Maria Ball is a 58 y.o. female  Chief Complaint  Patient presents with  . RUQ pain   ABDOMINAL PAIN- diagnosed with Ball on Friday  Duration: 11 days Onset: sudden Severity: severe Quality: sharp Location:  RUQ  Episode duration:  Radiation: into her back Frequency: intermittent Alleviating factors: nothing Aggravating factors: motrin Status: stable Treatments attempted: motrin Fever: no Nausea: no Vomiting: no Weight loss: no Decreased appetite: yes Diarrhea: no Constipation: no Blood in stool: no Heartburn: yes Jaundice: no Rash: yes Dysuria/urinary frequency: no Hematuria: no History of sexually transmitted disease: no Recurrent NSAID use: no  CLINICAL DATA:  Right upper quadrant pain for 2 days  EXAM: ULTRASOUND ABDOMEN LIMITED RIGHT UPPER QUADRANT  COMPARISON:  None.  FINDINGS: Gallbladder:  No gallstones or wall thickening visualized. No sonographic Murphy sign noted by sonographer.  Common bile duct:  Diameter: 4 mm  Liver:  No focal lesion identified. Within normal limits in parenchymal echogenicity. Portal vein is patent on color Doppler imaging with normal direction of blood flow towards the liver.  Other: None.  IMPRESSION: No sonographic evidence for acute cholecystitis.  CLINICAL DATA:  Acute generalized abdominal pain  EXAM: CT ABDOMEN AND PELVIS WITH CONTRAST  TECHNIQUE: Multidetector CT imaging of the abdomen and pelvis was performed using the standard protocol following bolus administration of intravenous contrast.  CONTRAST:  167mL OMNIPAQUE IOHEXOL 300 MG/ML  SOLN  COMPARISON:  None.  FINDINGS: Lower chest: No acute  abnormality.  Hepatobiliary: No focal liver abnormality is seen. No gallstones, gallbladder wall thickening, or biliary dilatation.  Pancreas: Unremarkable. No pancreatic ductal dilatation or surrounding inflammatory changes.  Spleen: Normal in size without focal abnormality.  Adrenals/Urinary Tract: Adrenal glands are unremarkable. Kidneys are normal, without renal calculi, focal lesion, or hydronephrosis. Bladder is unremarkable.  Stomach/Bowel: Stomach is within normal limits. Appendix appears normal. No evidence of bowel wall thickening, distention, or inflammatory changes.  Vascular/Lymphatic: Normal caliber abdominal aorta with mild atherosclerosis. No lymphadenopathy.  Reproductive: Uterus and bilateral adnexa are unremarkable.  Other: No abdominal wall hernia or abnormality. No abdominopelvic ascites.  Musculoskeletal: No acute osseous abnormality. No aggressive osseous lesion. Degenerative disease with disc height loss at L5-S1.  IMPRESSION: 1. No acute abdominal or pelvic pathology.  Relevant past medical, surgical, family and social history reviewed and updated as indicated. Interim medical history since our last visit reviewed. Allergies and medications reviewed and updated.  Review of Systems  Constitutional: Positive for appetite change. Negative for activity change, chills, diaphoresis, fatigue, fever and unexpected weight change.  Respiratory: Negative.   Cardiovascular: Negative.   Gastrointestinal: Positive for abdominal pain. Negative for abdominal distention, anal bleeding, blood in stool, constipation, diarrhea, nausea, rectal pain and vomiting.  Neurological: Negative.   Psychiatric/Behavioral: Negative.     Per HPI unless specifically indicated above     Objective:    BP 126/64   Pulse 90   Temp 98.5 F (36.9 C) (Oral)   Ht 5\' 6"  (1.676 m)   Wt 180 lb (81.6 kg)   LMP  (LMP Unknown)   SpO2 97%   BMI  29.05 kg/m   Wt Readings  from Last 3 Encounters:  02/19/19 180 lb (81.6 kg)  02/10/19 185 lb (83.9 kg)  01/01/19 186 lb 9.6 oz (84.6 kg)    Physical Exam Vitals signs and nursing note reviewed.  Constitutional:      General: She is not in acute distress.    Appearance: Normal appearance. She is not ill-appearing, toxic-appearing or diaphoretic.     Comments: Uncomfortable appearing  HENT:     Head: Normocephalic and atraumatic.     Right Ear: External ear normal.     Left Ear: External ear normal.     Nose: Nose normal.     Mouth/Throat:     Mouth: Mucous membranes are moist.     Pharynx: Oropharynx is clear.  Eyes:     General: No scleral icterus.       Right eye: No discharge.        Left eye: No discharge.     Extraocular Movements: Extraocular movements intact.     Conjunctiva/sclera: Conjunctivae normal.     Pupils: Pupils are equal, round, and reactive to light.  Neck:     Musculoskeletal: Normal range of motion and neck supple.  Cardiovascular:     Rate and Rhythm: Normal rate and regular rhythm.     Pulses: Normal pulses.     Heart sounds: Normal heart sounds. No murmur. No friction rub. No gallop.   Pulmonary:     Effort: Pulmonary effort is normal. No respiratory distress.     Breath sounds: Normal breath sounds. No stridor. No wheezing, rhonchi or rales.  Chest:     Chest wall: No tenderness.  Abdominal:     General: Bowel sounds are normal.     Palpations: Abdomen is soft.     Tenderness: There is abdominal tenderness in the right upper quadrant. Negative signs include Murphy's sign.    Musculoskeletal: Normal range of motion.  Skin:    General: Skin is warm and dry.     Capillary Refill: Capillary refill takes less than 2 seconds.     Coloration: Skin is not jaundiced or pale.     Findings: No bruising, erythema, lesion or rash.  Neurological:     General: No focal deficit present.     Mental Status: She is alert and oriented to person, place, and time. Mental status is at  baseline.  Psychiatric:        Mood and Affect: Mood normal.        Behavior: Behavior normal.        Thought Content: Thought content normal.        Judgment: Judgment normal.     Results for orders placed or performed during the hospital encounter of 02/10/19  Lipase, blood  Result Value Ref Range   Lipase 35 11 - 51 U/L  Comprehensive metabolic panel  Result Value Ref Range   Sodium 136 135 - 145 mmol/L   Potassium 4.3 3.5 - 5.1 mmol/L   Chloride 103 98 - 111 mmol/L   CO2 23 22 - 32 mmol/L   Glucose, Bld 115 (H) 70 - 99 mg/dL   BUN 12 6 - 20 mg/dL   Creatinine, Ser 0.69 0.44 - 1.00 mg/dL   Calcium 9.3 8.9 - 10.3 mg/dL   Total Protein 7.9 6.5 - 8.1 g/dL   Albumin 4.5 3.5 - 5.0 g/dL   AST 28 15 - 41 U/L   ALT 20 0 - 44 U/L   Alkaline  Phosphatase 146 (H) 38 - 126 U/L   Total Bilirubin 0.9 0.3 - 1.2 mg/dL   GFR calc non Af Amer >60 >60 mL/min   GFR calc Af Amer >60 >60 mL/min   Anion gap 10 5 - 15  CBC  Result Value Ref Range   WBC 8.4 4.0 - 10.5 K/uL   RBC 5.33 (H) 3.87 - 5.11 MIL/uL   Hemoglobin 14.5 12.0 - 15.0 g/dL   HCT 44.0 36.0 - 46.0 %   MCV 82.6 80.0 - 100.0 fL   MCH 27.2 26.0 - 34.0 pg   MCHC 33.0 30.0 - 36.0 g/dL   RDW 12.9 11.5 - 15.5 %   Platelets 380 150 - 400 K/uL   nRBC 0.0 0.0 - 0.2 %      Assessment & Plan:   Problem List Items Addressed This Visit    None    Visit Diagnoses    RUQ pain    -  Primary   Complicated by Ball- will obtain HIDA and get her into general surgery. Will start omeprazole and carafate- check back in no more than 1 week.    Relevant Orders   CBC With Differential/Platelet   Comprehensive metabolic panel   NM Hepato W/Eject Fract   Ambulatory referral to General Surgery   Flu vaccine need       Flu shot given today.   Relevant Orders   Flu Vaccine QUAD 36+ mos IM (Completed)   Right upper quadrant pain       Relevant Orders   NM Hepato W/Eject Fract       Follow up plan: Return in about 1 week (around  02/26/2019) for with Jolene.

## 2019-02-20 ENCOUNTER — Ambulatory Visit: Payer: Self-pay | Admitting: Surgery

## 2019-02-20 LAB — COMPREHENSIVE METABOLIC PANEL
ALT: 21 IU/L (ref 0–32)
AST: 17 IU/L (ref 0–40)
Albumin/Globulin Ratio: 1.8 (ref 1.2–2.2)
Albumin: 4.5 g/dL (ref 3.8–4.9)
Alkaline Phosphatase: 142 IU/L — ABNORMAL HIGH (ref 39–117)
BUN/Creatinine Ratio: 9 (ref 9–23)
BUN: 6 mg/dL (ref 6–24)
Bilirubin Total: <0.2 mg/dL (ref 0.0–1.2)
CO2: 22 mmol/L (ref 20–29)
Calcium: 9.3 mg/dL (ref 8.7–10.2)
Chloride: 105 mmol/L (ref 96–106)
Creatinine, Ser: 0.67 mg/dL (ref 0.57–1.00)
GFR calc Af Amer: 111 mL/min/{1.73_m2} (ref >59)
GFR calc non Af Amer: 97 mL/min/{1.73_m2} (ref >59)
Globulin, Total: 2.5 g/dL (ref 1.5–4.5)
Glucose: 76 mg/dL (ref 65–99)
Potassium: 4.6 mmol/L (ref 3.5–5.2)
Sodium: 141 mmol/L (ref 134–144)
Total Protein: 7 g/dL (ref 6.0–8.5)

## 2019-02-21 ENCOUNTER — Encounter: Payer: Self-pay | Admitting: General Surgery

## 2019-02-21 ENCOUNTER — Encounter
Admission: RE | Admit: 2019-02-21 | Discharge: 2019-02-21 | Disposition: A | Discharge status: Home / Self Care | Payer: BC Managed Care – PPO | Source: Ambulatory Visit | Attending: Family Medicine | Admitting: Family Medicine

## 2019-02-21 ENCOUNTER — Other Ambulatory Visit: Payer: Self-pay

## 2019-02-21 ENCOUNTER — Ambulatory Visit: Payer: BC Managed Care – PPO | Admitting: General Surgery

## 2019-02-21 VITALS — BP 134/78 | HR 103 | Temp 97.2°F | Resp 14 | Ht 66.0 in | Wt 178.4 lb

## 2019-02-21 DIAGNOSIS — R1011 Right upper quadrant pain: Secondary | ICD-10-CM

## 2019-02-21 MED ORDER — TECHNETIUM TC 99M MEBROFENIN IV KIT
5.3700 | PACK | Freq: Once | INTRAVENOUS | Status: AC | PRN
  Administered 2019-02-21: 5.37 via INTRAVENOUS

## 2019-02-21 MED ORDER — GABAPENTIN 300 MG PO CAPS: ORAL_CAPSULE | ORAL | 0 refills | Status: DC

## 2019-02-21 NOTE — Patient Instructions (Signed)
Please contact our office if you are still having pain after you have completed your course of treatment for shingles. Please start Gabapentin medication as soon as you pick up your prescription from your pharmacy. Please feel free to contact our office if you have any questions or concerns.

## 2019-02-22 ENCOUNTER — Encounter: Payer: Self-pay | Admitting: General Surgery

## 2019-02-22 NOTE — Progress Notes (Signed)
Patient ID: Maria Ball, female   DOB: [birth year], 59 y.o.   MRN: [id number]  Chief Complaint  Patient presents with  . New Patient (Initial Visit)    discuss hida scan - RUQ    HPI Maria Ball is a 58 y.o. female.  She has been referred by her primary care provider for further evaluation of right upper quadrant pain.  The patient reports that on February 08, 2019, she woke up with pain in her upper mid back and mid epigastrium with radiation around her right side.  There were no inciting factors to her knowledge.  The pain reoccurred on October 4 and she presented to the emergency department.  She was evaluated for a number of potential etiologies of abdominal pain, she did have a CT scan of the abdomen and pelvis as well as a right upper quadrant ultrasound.  She had significant fecal loading seen on the CT scan.  Her appendix was normal and her gallbladder was also normal.  2 days later, she noticed a vesicular rash in the distribution of her pain.  She was subsequently diagnosed with shingles and started on valacyclovir.  Her pain has persisted.  She states that she is constantly uncomfortable.  She denies any nausea or vomiting.  No diarrhea.  She says that Motrin alleviates some of this discomfort.  The pain is worse with eating, specifically with eating salads.  This is the first time she has had pain like this.  She denies any history of jaundice or pancreatitis.  She did report having a dark stool after the ER visit on October 4, but denies frank blood or melena.  She has not had acholic stools.  She denies any history of heavy NSAID use.  No history of gastroesophageal reflux disease or peptic ulcer disease.  She has never had upper endoscopy.  She had a colonoscopy in 2019 that showed some small polyps.  A HIDA scan was performed today that was normal.   Past Medical History:  Diagnosis Date  . Alkaline phosphatase elevation   . Depression   . Depression 12/11/2014  . DVT  (deep venous thrombosis) (HCC)    while taking OCP in the 1980's  . Hypertension   . Shingles     Past Surgical History:  Procedure Laterality Date  . COLONOSCOPY WITH PROPOFOL N/A 04/16/2018   Procedure: COLONOSCOPY WITH PROPOFOL;  Surgeon: Jonathon Bellows, MD;  Location: [facility] ENDOSCOPY;  Service: Gastroenterology;  Laterality: N/A;  . DILATION AND CURETTAGE OF UTERUS    . TONSILLECTOMY      Family History  Problem Relation Age of Onset  . Cancer Mother        colon-rectal  . Emphysema Father   . Cancer Sister        breast  . Hypertension Sister   . Diabetes Sister   . Breast cancer Sister 6  . Stroke Sister   . Hypertension Brother     Social History Social History   Tobacco Use  . Smoking status: Never Smoker  . Smokeless tobacco: Never Used  Substance Use Topics  . Alcohol use: No    Alcohol/week: 0.0 standard drinks  . Drug use: No    No Known Allergies  Current Outpatient Medications  Medication Sig Dispense Refill  . omeprazole (PRILOSEC) 40 MG capsule Take 1 capsule (40 mg total) by mouth daily. 30 capsule 3  . sucralfate (CARAFATE) 1 g tablet Take 1 tablet (1 g total) by mouth 4 (four)  times daily -  with meals and at bedtime. 120 tablet 2  . valACYclovir (VALTREX) 1000 MG tablet     . gabapentin (NEURONTIN) 300 MG capsule Take 1 capsule (300 mg total) by mouth at bedtime for 7 days, THEN 1 capsule (300 mg total) 2 (two) times daily for 7 days, THEN 1 capsule (300 mg total) 3 (three) times daily. 111 capsule 0   No current facility-administered medications for this visit.     Review of Systems Review of Systems  All other systems reviewed and are negative. As discussed in the history of present illness  Blood pressure 134/78, pulse (!) 103, temperature (!) 97.2 F (36.2 C), temperature source Temporal, resp. rate 14, height 5\' 6"  (1.676 m), weight 178 lb 6.4 oz (80.9 kg), SpO2 97 %. Body mass index is 28.79 kg/m.  Physical Exam Physical  Exam Constitutional:      General: She is not in acute distress.    Appearance: Normal appearance.  HENT:     Head: Normocephalic and atraumatic.     Nose:     Comments: Covered with a mask secondary to COVID-19 precautions    Mouth/Throat:     Comments: Covered with a mask secondary to COVID-19 precautions Eyes:     General: No scleral icterus.       Right eye: No discharge.        Left eye: No discharge.  Neck:     Musculoskeletal: No neck rigidity.     Comments: No palpable thyromegaly or dominant thyroid masses appreciated Cardiovascular:     Rate and Rhythm: Regular rhythm.     Pulses: Normal pulses.  Pulmonary:     Effort: Pulmonary effort is normal.  Abdominal:     General: Bowel sounds are normal.     Palpations: Abdomen is soft.     Tenderness: There is no abdominal tenderness. There is no guarding or rebound.     Comments: Percell Miller sign is negative.  She does have a vesicular rash in various stages of healing in a dermatomal pattern corresponding to T8 on the right.  This also corresponds to the right upper quadrant, in the area of her pain.  Genitourinary:    Comments: Deferred Musculoskeletal:        General: No swelling.  Lymphadenopathy:     Cervical: No cervical adenopathy.  Skin:    General: Skin is warm and dry.     Findings: Rash present.  Neurological:     General: No focal deficit present.     Mental Status: She is alert and oriented to person, place, and time.  Psychiatric:        Mood and Affect: Mood normal.        Behavior: Behavior normal.     Data Reviewed Labs from her emergency department visit on October 4 were reviewed.  These do not show any significant abnormalities.  She does have an elevated alkaline phosphatase but this is chronic and goes back for many years.  Bilirubin is normal as are her transaminases.  White count is also normal.  A repeat comprehensive metabolic panel was performed on October 13.  I also reviewed these labs and the  results are essentially the same as those obtained on October 4.  I reviewed the imaging studies that have been performed, namely a CT scan, a right upper quadrant ultrasound, and a nuclear medicine (HIDA) scan.  All of these are normal and do not show any evidence of gallbladder pathology.  The radiologist did report that the patient had pain with ingestion of the Ensure compound when she underwent her HIDA scan.  Assessment This is a 58 year old woman who had onset of abdominal pain on October 2.  Evaluation in the emergency department 2 days later was negative for any surgical pathology.  It was thought that she was potentially just constipated.  Shortly after her ED visit, she developed shingles.  Additional radiographic and laboratory evaluation has been negative for any gallbladder pathology.  My physical exam is also negative for findings consistent with cholecystitis.  As she has no gallstones, I do not think biliary colic is the source of her pain.  The nuclear medicine scan essentially rules out biliary dyskinesia.  My suspicion is that the shingles are the primary source of her pain.  Plan I have recommended that Ms. Kasperski complete her course of valacyclovir.  I prescribed gabapentin to see if this helps with her pain. I cautioned her that postherpetic neuralgia can last months to years, if it occurs.  She does have some association of her pain with eating, she may benefit from upper endoscopy to evaluate for gastrointestinal source of her pain, if it does not dissipate after her shingles have completely resolved.  At this time, I do not think she has any general surgical indications, therefore we will see her on an as-needed basis.    Fredirick Maudlin 02/22/2019, 3:49 PM

## 2019-02-26 ENCOUNTER — Ambulatory Visit: Payer: BC Managed Care – PPO | Admitting: Nurse Practitioner

## 2019-03-25 ENCOUNTER — Other Ambulatory Visit: Payer: Self-pay

## 2019-03-26 ENCOUNTER — Ambulatory Visit (INDEPENDENT_AMBULATORY_CARE_PROVIDER_SITE_OTHER): Payer: BC Managed Care – PPO | Admitting: Nurse Practitioner

## 2019-03-26 ENCOUNTER — Encounter: Payer: Self-pay | Admitting: Nurse Practitioner

## 2019-03-26 ENCOUNTER — Other Ambulatory Visit: Payer: Self-pay

## 2019-03-26 ENCOUNTER — Other Ambulatory Visit (HOSPITAL_COMMUNITY)
Admission: RE | Admit: 2019-03-26 | Discharge: 2019-03-26 | Disposition: A | Discharge status: Home / Self Care | Payer: BC Managed Care – PPO | Source: Ambulatory Visit | Attending: Nurse Practitioner | Admitting: Nurse Practitioner

## 2019-03-26 VITALS — BP 118/70 | HR 84 | Temp 98.7°F | Ht 66.0 in | Wt 183.0 lb

## 2019-03-26 DIAGNOSIS — Z1231 Encounter for screening mammogram for malignant neoplasm of breast: Secondary | ICD-10-CM

## 2019-03-26 DIAGNOSIS — Z0000 Encounter for general adult medical examination without abnormal findings: Secondary | ICD-10-CM

## 2019-03-26 DIAGNOSIS — Z6830 Body mass index (BMI) 30.0-30.9, adult: Secondary | ICD-10-CM

## 2019-03-26 DIAGNOSIS — E559 Vitamin D deficiency, unspecified: Secondary | ICD-10-CM | POA: Diagnosis not present

## 2019-03-26 DIAGNOSIS — R748 Abnormal levels of other serum enzymes: Secondary | ICD-10-CM | POA: Diagnosis not present

## 2019-03-26 DIAGNOSIS — I10 Essential (primary) hypertension: Secondary | ICD-10-CM | POA: Diagnosis not present

## 2019-03-26 DIAGNOSIS — E6609 Other obesity due to excess calories: Secondary | ICD-10-CM

## 2019-03-26 MED ORDER — SHINGRIX 50 MCG/0.5ML IM SUSR: 0.5000 mL | Freq: Once | INTRAMUSCULAR | 0 refills | Status: DC

## 2019-03-26 NOTE — Progress Notes (Signed)
BP 118/70   Pulse 84   Temp 98.7 F (37.1 C) (Oral)   Ht 5\' 6"  (1.676 m)   Wt 183 lb (83 kg)   LMP  (LMP Unknown)   SpO2 96%   BMI 29.54 kg/m    Subjective:    Patient ID: Maria Ball, female    DOB: [birth year], 58 y.o.   MRN: [id number]  HPI: Maria Ball is a 58 y.o. female presenting on 03/26/2019 for comprehensive medical examination. Current medical complaints include:none  She currently lives with: with children Menopausal Symptoms: no  Depression Screen done today and results listed below:  Depression screen [facility] 2/9 03/26/2019 03/23/2018 03/21/2017 02/29/2016  Decreased Interest 0 0 0 0  Down, Depressed, Hopeless 0 0 0 0  PHQ - 2 Score 0 0 0 0  Altered sleeping 0 0 0 -  Tired, decreased energy 0 0 0 -  Change in appetite 0 0 0 -  Feeling bad or failure about yourself  0 0 0 -  Trouble concentrating 0 0 0 -  Moving slowly or fidgety/restless 0 0 0 -  Suicidal thoughts 0 0 0 -  PHQ-9 Score 0 0 0 -  Difficult doing work/chores - Not difficult at all - -    The patient does not have a history of falls. I did not complete a risk assessment for falls. A plan of care for falls was not documented.   Past Medical History:  Past Medical History:  Diagnosis Date  . Alkaline phosphatase elevation   . Depression   . Depression 12/11/2014  . DVT (deep venous thrombosis) (HCC)    while taking OCP in the 1980's  . Hypertension   . Ball   . Ball     Surgical History:  Past Surgical History:  Procedure Laterality Date  . COLONOSCOPY WITH PROPOFOL N/A 04/16/2018   Procedure: COLONOSCOPY WITH PROPOFOL;  Surgeon: Jonathon Bellows, MD;  Location: [facility] ENDOSCOPY;  Service: Gastroenterology;  Laterality: N/A;  . DILATION AND CURETTAGE OF UTERUS    . TONSILLECTOMY      Medications:  Current Outpatient Medications on File Prior to Visit  Medication Sig  . gabapentin (NEURONTIN) 300 MG capsule Take 1 capsule (300 mg total) by mouth at bedtime for 7  days, THEN 1 capsule (300 mg total) 2 (two) times daily for 7 days, THEN 1 capsule (300 mg total) 3 (three) times daily.  Marland Kitchen omeprazole (PRILOSEC) 40 MG capsule Take 1 capsule (40 mg total) by mouth daily.  . sucralfate (CARAFATE) 1 g tablet Take 1 tablet (1 g total) by mouth 4 (four) times daily -  with meals and at bedtime.   No current facility-administered medications on file prior to visit.     Allergies:  No Known Allergies  Social History:  Social History   Socioeconomic History  . Marital status: Divorced    Spouse name: Not on file  . Number of children: Not on file  . Years of education: Not on file  . Highest education level: Not on file  Occupational History  . Not on file  Social Needs  . Financial resource strain: Not on file  . Food insecurity    Worry: Not on file    Inability: Not on file  . Transportation needs    Medical: Not on file    Non-medical: Not on file  Tobacco Use  . Smoking status: Never Smoker  . Smokeless tobacco: Never Used  Substance and Sexual Activity  .  Alcohol use: No    Alcohol/week: 0.0 standard drinks  . Drug use: No  . Sexual activity: Never  Lifestyle  . Physical activity    Days per week: Not on file    Minutes per session: Not on file  . Stress: Not on file  Relationships  . Social Herbalist on phone: Not on file    Gets together: Not on file    Attends religious service: Not on file    Active member of club or organization: Not on file    Attends meetings of clubs or organizations: Not on file    Relationship status: Not on file  . Intimate partner violence    Fear of current or ex partner: Not on file    Emotionally abused: Not on file    Physically abused: Not on file    Forced sexual activity: Not on file  Other Topics Concern  . Not on file  Social History Narrative  . Not on file   Social History   Tobacco Use  Smoking Status Never Smoker  Smokeless Tobacco Never Used   Social History    Substance and Sexual Activity  Alcohol Use No  . Alcohol/week: 0.0 standard drinks    Family History:  Family History  Problem Relation Age of Onset  . Cancer Mother        colon-rectal  . Emphysema Father   . Cancer Sister        breast  . Hypertension Sister   . Diabetes Sister   . Breast cancer Sister 36  . Stroke Sister   . Hypertension Brother     Past medical history, surgical history, medications, allergies, family history and social history reviewed with patient today and changes made to appropriate areas of the chart.   Review of Systems - negative All other ROS negative except what is listed above and in the HPI.      Objective:    BP 118/70   Pulse 84   Temp 98.7 F (37.1 C) (Oral)   Ht 5\' 6"  (1.676 m)   Wt 183 lb (83 kg)   LMP  (LMP Unknown)   SpO2 96%   BMI 29.54 kg/m   Wt Readings from Last 3 Encounters:  03/26/19 183 lb (83 kg)  02/21/19 178 lb 6.4 oz (80.9 kg)  02/19/19 180 lb (81.6 kg)    Physical Exam Constitutional:      General: She is awake. She is not in acute distress.    Appearance: She is well-developed. She is not ill-appearing.  HENT:     Head: Normocephalic and atraumatic.     Right Ear: Hearing, tympanic membrane, ear canal and external ear normal. No drainage.     Left Ear: Hearing, tympanic membrane, ear canal and external ear normal. No drainage.     Nose: Nose normal.     Right Sinus: No maxillary sinus tenderness or frontal sinus tenderness.     Left Sinus: No maxillary sinus tenderness or frontal sinus tenderness.     Mouth/Throat:     Mouth: Mucous membranes are moist.     Pharynx: Oropharynx is clear. Uvula midline. No pharyngeal swelling, oropharyngeal exudate or posterior oropharyngeal erythema.  Eyes:     General: Lids are normal.        Right eye: No discharge.        Left eye: No discharge.     Extraocular Movements: Extraocular movements intact.     Conjunctiva/sclera:  Conjunctivae normal.     Pupils: Pupils  are equal, round, and reactive to light.     Visual Fields: Right eye visual fields normal and left eye visual fields normal.  Neck:     Musculoskeletal: Normal range of motion and neck supple.     Thyroid: No thyromegaly.     Vascular: No carotid bruit.     Trachea: Trachea normal.  Cardiovascular:     Rate and Rhythm: Normal rate and regular rhythm.     Heart sounds: Normal heart sounds. No murmur. No gallop.   Pulmonary:     Effort: Pulmonary effort is normal. No accessory muscle usage or respiratory distress.     Breath sounds: Normal breath sounds.  Chest:     Breasts:        Right: Normal.        Left: Normal.     Comments: Dense breast tissue on exam. Abdominal:     General: Bowel sounds are normal.     Palpations: Abdomen is soft. There is no hepatomegaly or splenomegaly.     Tenderness: There is no abdominal tenderness.  Genitourinary:    Exam position: Lithotomy position.     Labia:        Right: No rash.        Left: No rash.      Vagina: Normal.     Cervix: Normal.     Uterus: Normal.      Adnexa: Right adnexa normal and left adnexa normal.     Comments: Cervix slightly posterior, viewed. Musculoskeletal: Normal range of motion.     Right lower leg: No edema.     Left lower leg: No edema.  Lymphadenopathy:     Head:     Right side of head: No submental, submandibular, tonsillar, preauricular or posterior auricular adenopathy.     Left side of head: No submental, submandibular, tonsillar, preauricular or posterior auricular adenopathy.     Cervical: No cervical adenopathy.     Upper Body:     Right upper body: No supraclavicular, axillary or pectoral adenopathy.     Left upper body: No supraclavicular, axillary or pectoral adenopathy.  Skin:    General: Skin is warm and dry.     Capillary Refill: Capillary refill takes less than 2 seconds.     Findings: No rash.  Neurological:     Mental Status: She is alert and oriented to person, place, and time.      Cranial Nerves: Cranial nerves are intact.     Gait: Gait is intact.     Deep Tendon Reflexes: Reflexes are normal and symmetric.     Reflex Scores:      Brachioradialis reflexes are 2+ on the right side and 2+ on the left side.      Patellar reflexes are 2+ on the right side and 2+ on the left side. Psychiatric:        Attention and Perception: Attention normal.        Mood and Affect: Mood normal.        Speech: Speech normal.        Behavior: Behavior normal. Behavior is cooperative.        Thought Content: Thought content normal.        Judgment: Judgment normal.     Results for orders placed or performed in visit on 02/19/19  CBC With Differential/Platelet  Result Value Ref Range   WBC 6.3 3.4 - 10.8 x10E3/uL   RBC  4.70 3.77 - 5.28 x10E6/uL   Hemoglobin 13.6 11.1 - 15.9 g/dL   Hematocrit 39.7 34.0 - 46.6 %   MCV 85 79 - 97 fL   MCH 28.9 26.6 - 33.0 pg   MCHC 34.3 31.5 - 35.7 g/dL   RDW 14.0 11.7 - 15.4 %   Platelets 370 150 - 450 x10E3/uL   Neutrophils 50 Not Estab. %   Lymphs 42 Not Estab. %   MID 8 Not Estab. %   Neutrophils Absolute 3.2 1.4 - 7.0 x10E3/uL   Lymphocytes Absolute 2.6 0.7 - 3.1 x10E3/uL   MID (Absolute) 0.5 0.1 - 1.6 X10E3/uL  Comprehensive metabolic panel  Result Value Ref Range   Glucose 76 65 - 99 mg/dL   BUN 6 6 - 24 mg/dL   Creatinine, Ser 0.67 0.57 - 1.00 mg/dL   GFR calc non Af Amer 97 >59 mL/min/1.73   GFR calc Af Amer 111 >59 mL/min/1.73   BUN/Creatinine Ratio 9 9 - 23   Sodium 141 134 - 144 mmol/L   Potassium 4.6 3.5 - 5.2 mmol/L   Chloride 105 96 - 106 mmol/L   CO2 22 20 - 29 mmol/L   Calcium 9.3 8.7 - 10.2 mg/dL   Total Protein 7.0 6.0 - 8.5 g/dL   Albumin 4.5 3.8 - 4.9 g/dL   Globulin, Total 2.5 1.5 - 4.5 g/dL   Albumin/Globulin Ratio 1.8 1.2 - 2.2   Bilirubin Total <0.2 0.0 - 1.2 mg/dL   Alkaline Phosphatase 142 (H) 39 - 117 IU/L   AST 17 0 - 40 IU/L   ALT 21 0 - 32 IU/L      Assessment & Plan:   Problem List Items  Addressed This Visit      Cardiovascular and Mediastinum   Hypertension    Ongoing, stable with diet changes.  BP below goal.  No medications at this time.  Continue to monitor and initiate as needed.        Relevant Orders   Comprehensive metabolic panel     Other   Alkaline phosphatase raised    Check CMP today.      Obesity    Recommend continued focus on health diet choices and regular physical activity (30 minutes 5 days a week).        Other Visit Diagnoses    Annual physical exam    -  Primary   Relevant Orders   CBC with Differential/Platelet out   Lipid Panel w/o Chol/HDL Ratio out   TSH   Cytology - PAP   Vitamin D deficiency       Check Vitamin D level today, history of low per report.   Relevant Orders   Vit D  25 hydroxy (rtn osteoporosis monitoring)   Encounter for screening mammogram for malignant neoplasm of breast       Relevant Orders   MM DIGITAL SCREENING BILATERAL       Follow up plan: Return in about 6 months (around 09/23/2019) for HTN.   LABORATORY TESTING:  - Pap smear: pap done  IMMUNIZATIONS:   - Tdap: Tetanus vaccination status reviewed: last tetanus booster within 10 years. - Influenza: Up to date - Pneumovax: Not applicable - Prevnar: Not applicable - HPV: Not applicable - Zostavax vaccine: will get in 2021 due to recent infection  SCREENING: -Mammogram: Ordered today  - Colonoscopy: Up to date  - Bone Density: Not applicable  -Hearing Test: Not applicable  -Spirometry: Not applicable   PATIENT COUNSELING:  Advised to take 1 mg of folate supplement per day if capable of pregnancy.   Sexuality: Discussed sexually transmitted diseases, partner selection, use of condoms, avoidance of unintended pregnancy  and contraceptive alternatives.   Advised to avoid cigarette smoking.  I discussed with the patient that most people either abstain from alcohol or drink within safe limits (<=14/week and <=4 drinks/occasion for males,  <=7/weeks and <= 3 drinks/occasion for females) and that the risk for alcohol disorders and other health effects rises proportionally with the number of drinks per week and how often a drinker exceeds daily limits.  Discussed cessation/primary prevention of drug use and availability of treatment for abuse.   Diet: Encouraged to adjust caloric intake to maintain  or achieve ideal body weight, to reduce intake of dietary saturated fat and total fat, to limit sodium intake by avoiding high sodium foods and not adding table salt, and to maintain adequate dietary potassium and calcium preferably from fresh fruits, vegetables, and low-fat dairy products.    stressed the importance of regular exercise  Injury prevention: Discussed safety belts, safety helmets, smoke detector, smoking near bedding or upholstery.   Dental health: Discussed importance of regular tooth brushing, flossing, and dental visits.    NEXT PREVENTATIVE PHYSICAL DUE IN 1 YEAR. Return in about 6 months (around 09/23/2019) for HTN.

## 2019-03-26 NOTE — Assessment & Plan Note (Signed)
Check CMP today 

## 2019-03-26 NOTE — Patient Instructions (Addendum)
[facility] at [facility]  Address: [address] [place], [place], [place] [address]  Phone: [phone]   Shingles  Shingles is an infection. It gives you a painful skin rash and blisters that have fluid in them. Shingles is caused by the same germ (virus) that causes chickenpox. Shingles only happens in people who:  Have had chickenpox.  Have been given a shot of medicine (vaccine) to protect against chickenpox. Shingles is rare in this group. The first symptoms of shingles may be itching, tingling, or pain in an area on your skin. A rash will show on your skin a few days or weeks later. The rash is likely to be on one side of your body. The rash usually has a shape like a belt or a band. Over time, the rash turns into fluid-filled blisters. The blisters will break open, change into scabs, and dry up. Medicines may:  Help with pain and itching.  Help you get better sooner.  Help to prevent long-term problems. Follow these instructions at home: Medicines  Take over-the-counter and prescription medicines only as told by your doctor.  Put on an anti-itch cream or numbing cream where you have a rash, blisters, or scabs. Do this as told by your doctor. Helping with itching and discomfort   Put cold, wet cloths (cold compresses) on the area of the rash or blisters as told by your doctor.  Cool baths can help you feel better. Try adding baking soda or dry oatmeal to the water to lessen itching. Do not bathe in hot water. Blister and rash care  Keep your rash covered with a loose bandage (dressing).  Wear loose clothing that does not rub on your rash.  Keep your rash and blisters clean. To do this, wash the area with mild soap and cool water as told by your doctor.  Check your rash every day for signs of infection. Check for: ? More redness, swelling, or pain. ? Fluid or blood. ? Warmth. ? Pus or a bad smell.  Do not scratch your rash. Do not pick at your  blisters. To help you to not scratch: ? Keep your fingernails clean and cut short. ? Wear gloves or mittens when you sleep, if scratching is a problem. General instructions  Rest as told by your doctor.  Keep all follow-up visits as told by your doctor. This is important.  Wash your hands often with soap and water. If soap and water are not available, use hand sanitizer. Doing this lowers your chance of getting a skin infection caused by germs (bacteria).  Your infection can cause chickenpox in people who have never had chickenpox or never got a shot of chickenpox vaccine. If you have blisters that did not change into scabs yet, try not to touch other people or be around other people, especially: ? Babies. ? Pregnant women. ? Children who have areas of red, itchy, or rough skin (eczema). ? Very old people who have transplants. ? People who have a long-term (chronic) sickness, like cancer or AIDS. Contact a doctor if:  Your pain does not get better with medicine.  Your pain does not get better after the rash heals.  You have any signs of infection in the rash area. These signs include: ? More redness, swelling, or pain around the rash. ? Fluid or blood coming from the rash. ? The rash area feeling warm to the touch. ? Pus or a bad smell coming from the rash. Get help  right away if:  The rash is on your face or nose.  You have pain in your face or pain by your eye.  You lose feeling on one side of your face.  You have trouble seeing.  You have ear pain, or you have ringing in your ear.  You have a loss of taste.  Your condition gets worse. Summary  Shingles gives you a painful skin rash and blisters that have fluid in them.  Shingles is an infection. It is caused by the same germ (virus) that causes chickenpox.  Keep your rash covered with a loose bandage (dressing). Wear loose clothing that does not rub on your rash.  If you have blisters that did not change into  scabs yet, try not to touch other people or be around people. This information is not intended to replace advice given to you by your health care provider. Make sure you discuss any questions you have with your health care provider. Document Released: 10/12/2007 Document Revised: 08/17/2018 Document Reviewed: 12/28/2016 Elsevier Patient Education  2020 [place] Maintenance, Female Adopting a healthy lifestyle and getting preventive care are important in promoting health and wellness. Ask your health care provider about:  The right schedule for you to have regular tests and exams.  Things you can do on your own to prevent diseases and keep yourself healthy. What should I know about diet, weight, and exercise? Eat a healthy diet   Eat a diet that includes plenty of vegetables, fruits, low-fat dairy products, and lean protein.  Do not eat a lot of foods that are high in solid fats, added sugars, or sodium. Maintain a healthy weight Body mass index (BMI) is used to identify weight problems. It estimates body fat based on height and weight. Your health care provider can help determine your BMI and help you achieve or maintain a healthy weight. Get regular exercise Get regular exercise. This is one of the most important things you can do for your health. Most adults should:  Exercise for at least 150 minutes each week. The exercise should increase your heart rate and make you sweat (moderate-intensity exercise).  Do strengthening exercises at least twice a week. This is in addition to the moderate-intensity exercise.  Spend less time sitting. Even light physical activity can be beneficial. Watch cholesterol and blood lipids Have your blood tested for lipids and cholesterol at 59 years of age, then have this test every 5 years. Have your cholesterol levels checked more often if:  Your lipid or cholesterol levels are high.  You are older than 59 years of age.  You are at  high risk for heart disease. What should I know about cancer screening? Depending on your health history and family history, you may need to have cancer screening at various ages. This may include screening for:  Breast cancer.  Cervical cancer.  Colorectal cancer.  Skin cancer.  Lung cancer. What should I know about heart disease, diabetes, and high blood pressure? Blood pressure and heart disease  High blood pressure causes heart disease and increases the risk of stroke. This is more likely to develop in people who have high blood pressure readings, are of African descent, or are overweight.  Have your blood pressure checked: ? Every 3-5 years if you are 43-57 years of age. ? Every year if you are 43 years old or older. Diabetes Have regular diabetes screenings. This checks your fasting blood sugar level. Have the screening done:  Once every three years after age 56 if you are at a normal weight and have a low risk for diabetes.  More often and at a younger age if you are overweight or have a high risk for diabetes. What should I know about preventing infection? Hepatitis B If you have a higher risk for hepatitis B, you should be screened for this virus. Talk with your health care provider to find out if you are at risk for hepatitis B infection. Hepatitis C Testing is recommended for:  Everyone born from [birth year] through 1966.  Anyone with known risk factors for hepatitis C. Sexually transmitted infections (STIs)  Get screened for STIs, including gonorrhea and chlamydia, if: ? You are sexually active and are younger than 59 years of age. ? You are older than 59 years of age and your health care provider tells you that you are at risk for this type of infection. ? Your sexual activity has changed since you were last screened, and you are at increased risk for chlamydia or gonorrhea. Ask your health care provider if you are at risk.  Ask your health care provider about whether  you are at high risk for HIV. Your health care provider may recommend a prescription medicine to help prevent HIV infection. If you choose to take medicine to prevent HIV, you should first get tested for HIV. You should then be tested every 3 months for as long as you are taking the medicine. Pregnancy  If you are about to stop having your period (premenopausal) and you may become pregnant, seek counseling before you get pregnant.  Take 400 to 800 micrograms (mcg) of folic acid every day if you become pregnant.  Ask for birth control (contraception) if you want to prevent pregnancy. Osteoporosis and menopause Osteoporosis is a disease in which the bones lose minerals and strength with aging. This can result in bone fractures. If you are 40 years old or older, or if you are at risk for osteoporosis and fractures, ask your health care provider if you should:  Be screened for bone loss.  Take a calcium or vitamin D supplement to lower your risk of fractures.  Be given hormone replacement therapy (HRT) to treat symptoms of menopause. Follow these instructions at home: Lifestyle  Do not use any products that contain nicotine or tobacco, such as cigarettes, e-cigarettes, and chewing tobacco. If you need help quitting, ask your health care provider.  Do not use street drugs.  Do not share needles.  Ask your health care provider for help if you need support or information about quitting drugs. Alcohol use  Do not drink alcohol if: ? Your health care provider tells you not to drink. ? You are pregnant, may be pregnant, or are planning to become pregnant.  If you drink alcohol: ? Limit how much you use to 0-1 drink a day. ? Limit intake if you are breastfeeding.  Be aware of how much alcohol is in your drink. In the [place] one drink equals one 12 oz bottle of beer (355 mL), one 5 oz glass of wine (148 mL), or one 1 oz glass of hard liquor (44 mL). General instructions  Schedule regular  health, dental, and eye exams.  Stay current with your vaccines.  Tell your health care provider if: ? You often feel depressed. ? You have ever been abused or do not feel safe at home. Summary  Adopting a healthy lifestyle and getting preventive care are important in promoting health  and wellness.  Follow your health care provider's instructions about healthy diet, exercising, and getting tested or screened for diseases.  Follow your health care provider's instructions on monitoring your cholesterol and blood pressure. This information is not intended to replace advice given to you by your health care provider. Make sure you discuss any questions you have with your health care provider. Document Released: 11/08/2010 Document Revised: 04/18/2018 Document Reviewed: 04/18/2018 Elsevier Patient Education  2020 [employer].   Colonoscopy, Adult A colonoscopy is an exam to look at the large intestine. It is done to check for problems, such as:  Lumps (tumors).  Growths (polyps).  Swelling (inflammation).  Bleeding. What happens before the procedure? Eating and drinking Follow instructions from your doctor about eating and drinking. These instructions may include:  A few days before the procedure - follow a low-fiber diet. ? Avoid nuts. ? Avoid seeds. ? Avoid dried fruit. ? Avoid raw fruits. ? Avoid vegetables.  1-3 days before the procedure - follow a clear liquid diet. Avoid liquids that have red or purple dye. Drink only clear liquids, such as: ? Clear broth or bouillon. ? Black coffee or tea. ? Clear juice. ? Clear soft drinks or sports drinks. ? Gelatin dessert. ? Popsicles.  On the day of the procedure - do not eat or drink anything during the 2 hours before the procedure. Up to 2 hours before the procedure, you may continue to drink clear liquids, such as water or clear fruit juice.  Bowel prep If you were prescribed an oral bowel prep:  Take it as told by your  doctor. Starting the day before your procedure, you will need to drink a lot of liquid. The liquid will cause you to poop (have bowel movements) until your poop is almost clear or light green.  To clean out your colon, you may also be given: ? Laxative medicines. ? Instructions about how to use an enema.  If your skin or butt gets irritated from diarrhea, you may: ? Wipe the area with wipes that have medicine in them, such as adult wet wipes with aloe and vitamin E. ? Put something on your skin that soothes the area, such as petroleum jelly.  If you throw up (vomit) while drinking the bowel prep, take a break for up to 60 minutes. Then begin the bowel prep again. If you keep throwing up and you cannot take the bowel prep without throwing up, call your doctor. General instructions  Ask your doctor about: ? Changing or stopping your normal medicines. This is important if you take iron pills, diabetes medicines, or blood thinners. ? Taking medicines such as aspirin and ibuprofen. These medicines can thin your blood. Do not take these medicines unless your doctor tells you to take them.  Plan to have someone take you home from the hospital or clinic. What happens during the procedure?   An IV tube may be put into one of your veins.  You will be given medicine to help you relax (sedative).  To reduce your risk of infection: ? Your doctors will wash their hands. ? Your anal area will be washed with soap.  You will be asked to lie on your side with your knees bent.  Your doctor will get a long, thin, flexible tube ready. The tube will have a camera and a light on the end.  The tube will be put into your anus.  The tube will be gently put into your large intestine.  Air will be delivered into your large intestine to keep it open. You may feel some pressure or cramping.  The camera will be used to take photos.  A small tissue sample may be removed for testing (biopsy).  If small  growths are found, your doctor may remove them and have them checked for cancer.  The tube that was put into your anus will be slowly removed. The procedure may vary among doctors and hospitals. What happens after the procedure?  Your doctor will check on you often until the medicines you were given have worn off.  Do not drive for 24 hours after the procedure.  You may have a small amount of blood in your poop.  You may pass gas.  You may have mild cramps or bloating in your belly (abdomen).  It is up to you to get the results of your procedure. Ask your doctor, or the department performing the procedure, when your results will be ready. Summary  A colonoscopy is an exam to look at the large intestine.  Follow instructions from your doctor about eating and drinking before the procedure.  If you were prescribed an oral bowel prep to clean out your colon, take it as told by your doctor.  Your doctor will check on you often until the medicines you were given have worn off.  Plan to have someone take you home from the hospital or clinic. This information is not intended to replace advice given to you by your health care provider. Make sure you discuss any questions you have with your health care provider. Document Released: 05/28/2010 Document Revised: 02/22/2017 Document Reviewed: 07/07/2015 Elsevier Patient Education  2020 [facility]) Exercise Recommendation  Being physically active is important to prevent heart disease and stroke, the nation's No. 1and No. 5killers. To improve overall cardiovascular health, we suggest at least 150 minutes per week of moderate exercise or 75 minutes per week of vigorous exercise (or a combination of moderate and vigorous activity). Thirty minutes a day, five times a week is an easy goal to remember. You will also experience benefits even if you divide your time into two or three segments of 10 to 15 minutes per  day.  For people who would benefit from lowering their blood pressure or cholesterol, we recommend 40 minutes of aerobic exercise of moderate to vigorous intensity three to four times a week to lower the risk for heart attack and stroke.  Physical activity is anything that makes you move your body and burn calories.  This includes things like climbing stairs or playing sports. Aerobic exercises benefit your heart, and include walking, jogging, swimming or biking. Strength and stretching exercises are best for overall stamina and flexibility.  The simplest, positive change you can make to effectively improve your heart health is to start walking. It's enjoyable, free, easy, social and great exercise. A walking program is flexible and boasts high success rates because people can stick with it. It's easy for walking to become a regular and satisfying part of life.   For Overall Cardiovascular Health:  At least 30 minutes of moderate-intensity aerobic activity at least 5 days per week for a total of 150  OR   At least 25 minutes of vigorous aerobic activity at least 3 days per week for a total of 75 minutes; or a combination of moderate- and vigorous-intensity aerobic activity  AND   Moderate- to high-intensity muscle-strengthening activity at least 2 days per week  for additional health benefits.  For Lowering Blood Pressure and Cholesterol  An average 40 minutes of moderate- to vigorous-intensity aerobic activity 3 or 4 times per week  What if I can't make it to the time goal? Something is always better than nothing! And everyone has to start somewhere. Even if you've been sedentary for years, today is the day you can begin to make healthy changes in your life. If you don't think you'll make it for 30 or 40 minutes, set a reachable goal for today. You can work up toward your overall goal by increasing your time as you get stronger. Don't let all-or-nothing thinking rob you of doing what  you can every day.  Source:http://www.heart.org

## 2019-03-26 NOTE — Assessment & Plan Note (Signed)
Ongoing, stable with diet changes.  BP below goal.  No medications at this time.  Continue to monitor and initiate as needed.

## 2019-03-26 NOTE — Assessment & Plan Note (Signed)
Recommend continued focus on health diet choices and regular physical activity (30 minutes 5 days a week). 

## 2019-03-27 LAB — CBC WITH DIFFERENTIAL/PLATELET
Basophils Absolute: 0.1 10*3/uL (ref 0.0–0.2)
Basos: 1 %
EOS (ABSOLUTE): 0.1 10*3/uL (ref 0.0–0.4)
Eos: 1 %
Hematocrit: 40.4 % (ref 34.0–46.6)
Hemoglobin: 13.1 g/dL (ref 11.1–15.9)
Immature Grans (Abs): 0 10*3/uL (ref 0.0–0.1)
Immature Granulocytes: 0 %
Lymphocytes Absolute: 2 10*3/uL (ref 0.7–3.1)
Lymphs: 32 %
MCH: 27.8 pg (ref 26.6–33.0)
MCHC: 32.4 g/dL (ref 31.5–35.7)
MCV: 86 fL (ref 79–97)
Monocytes Absolute: 0.5 10*3/uL (ref 0.1–0.9)
Monocytes: 8 %
Neutrophils Absolute: 3.6 10*3/uL (ref 1.4–7.0)
Neutrophils: 58 %
Platelets: 327 10*3/uL (ref 150–450)
RBC: 4.71 x10E6/uL (ref 3.77–5.28)
RDW: 13.6 % (ref 11.7–15.4)
WBC: 6.1 10*3/uL (ref 3.4–10.8)

## 2019-03-27 LAB — COMPREHENSIVE METABOLIC PANEL
ALT: 17 IU/L (ref 0–32)
AST: 17 IU/L (ref 0–40)
Albumin/Globulin Ratio: 2.2 (ref 1.2–2.2)
Albumin: 4.4 g/dL (ref 3.8–4.9)
Alkaline Phosphatase: 150 IU/L — ABNORMAL HIGH (ref 39–117)
BUN/Creatinine Ratio: 14 (ref 9–23)
BUN: 9 mg/dL (ref 6–24)
Bilirubin Total: <0.2 mg/dL (ref 0.0–1.2)
CO2: 26 mmol/L (ref 20–29)
Calcium: 9.3 mg/dL (ref 8.7–10.2)
Chloride: 105 mmol/L (ref 96–106)
Creatinine, Ser: 0.63 mg/dL (ref 0.57–1.00)
GFR calc Af Amer: 114 mL/min/{1.73_m2} (ref >59)
GFR calc non Af Amer: 98 mL/min/{1.73_m2} (ref >59)
Globulin, Total: 2 g/dL (ref 1.5–4.5)
Glucose: 72 mg/dL (ref 65–99)
Potassium: 4.5 mmol/L (ref 3.5–5.2)
Sodium: 144 mmol/L (ref 134–144)
Total Protein: 6.4 g/dL (ref 6.0–8.5)

## 2019-03-27 LAB — LIPID PANEL W/O CHOL/HDL RATIO
Cholesterol, Total: 199 mg/dL (ref 100–199)
HDL: 39 mg/dL — ABNORMAL LOW (ref >39)
LDL Chol Calc (NIH): 126 mg/dL — ABNORMAL HIGH (ref 0–99)
Triglycerides: 190 mg/dL — ABNORMAL HIGH (ref 0–149)
VLDL Cholesterol Cal: 34 mg/dL (ref 5–40)

## 2019-03-27 LAB — VITAMIN D 25 HYDROXY (VIT D DEFICIENCY, FRACTURES): Vit D, 25-Hydroxy: 23.8 ng/mL — ABNORMAL LOW (ref 30.0–100.0)

## 2019-03-27 LAB — TSH: TSH: 1.16 u[IU]/mL (ref 0.450–4.500)

## 2019-03-28 LAB — CYTOLOGY - PAP
Comment: NEGATIVE
Diagnosis: NEGATIVE
High risk HPV: NEGATIVE

## 2019-04-22 ENCOUNTER — Other Ambulatory Visit: Payer: Self-pay

## 2019-04-22 ENCOUNTER — Ambulatory Visit
Admission: RE | Admit: 2019-04-22 | Discharge: 2019-04-22 | Disposition: A | Discharge status: Home / Self Care | Payer: BC Managed Care – PPO | Source: Ambulatory Visit | Attending: Nurse Practitioner | Admitting: Nurse Practitioner

## 2019-04-22 DIAGNOSIS — Z1231 Encounter for screening mammogram for malignant neoplasm of breast: Secondary | ICD-10-CM | POA: Insufficient documentation

## 2019-09-23 ENCOUNTER — Ambulatory Visit: Payer: BC Managed Care – PPO | Admitting: Nurse Practitioner

## 2020-03-27 ENCOUNTER — Ambulatory Visit (INDEPENDENT_AMBULATORY_CARE_PROVIDER_SITE_OTHER): Payer: BC Managed Care – PPO | Admitting: Nurse Practitioner

## 2020-03-27 ENCOUNTER — Other Ambulatory Visit: Payer: Self-pay

## 2020-03-27 ENCOUNTER — Encounter: Payer: Self-pay | Admitting: Nurse Practitioner

## 2020-03-27 VITALS — BP 130/78 | HR 87 | Temp 98.3°F | Ht 66.0 in | Wt 183.0 lb

## 2020-03-27 DIAGNOSIS — Z1329 Encounter for screening for other suspected endocrine disorder: Secondary | ICD-10-CM | POA: Diagnosis not present

## 2020-03-27 DIAGNOSIS — Z23 Encounter for immunization: Secondary | ICD-10-CM | POA: Diagnosis not present

## 2020-03-27 DIAGNOSIS — Z0000 Encounter for general adult medical examination without abnormal findings: Secondary | ICD-10-CM | POA: Diagnosis not present

## 2020-03-27 DIAGNOSIS — Z13220 Encounter for screening for lipoid disorders: Secondary | ICD-10-CM | POA: Diagnosis not present

## 2020-03-27 DIAGNOSIS — Z1231 Encounter for screening mammogram for malignant neoplasm of breast: Secondary | ICD-10-CM | POA: Diagnosis not present

## 2020-03-27 MED ORDER — VALACYCLOVIR HCL 1 G PO TABS: 1000.0000 mg | ORAL_TABLET | Freq: Every day | ORAL | 4 refills | Status: AC

## 2020-03-27 NOTE — Patient Instructions (Signed)
[facility] at [facility]  Address: [place], [place], [place] [address]  Phone: [phone]   Mammogram A mammogram is an X-ray of the breasts that is done to check for changes that are not normal. This test can screen for and find any changes that may suggest breast cancer. Mammograms are regularly done on women. A man may have a mammogram if he has a lump or swelling in his breast. This test can also help to find other changes and variations in the breast. Tell a doctor:  About any allergies you have.  If you have breast implants.  If you have had previous breast disease, biopsy, or surgery.  If you are breastfeeding.  If you are younger than age 48.  If you have a family history of breast cancer.  Whether you are pregnant or may be pregnant. What are the risks? Generally, this is a safe procedure. However, problems may occur, including:  Exposure to radiation. Radiation levels are very low with this test.  The results being misinterpreted.  The need for further tests.  The inability of the mammogram to detect certain cancers. What happens before the procedure?  Have this test done about 1-2 weeks after your period. This is usually when your breasts are the least tender.  If you are visiting a new doctor or clinic, send any past mammogram images to your new doctor's office.  Wash your breasts and under your arms the day of the test.  Do not use deodorants, perfumes, lotions, or powders on the day of the test.  Take off any jewelry from your neck.  Wear clothes that you can change into and out of easily. What happens during the procedure?   You will undress from the waist up. You will put on a gown.  You will stand in front of the X-ray machine.  Each breast will be placed between two plastic or glass plates. The plates will press down on your breast for a few seconds. Try to stay as relaxed as possible. This does not cause any  harm to your breasts. Any discomfort you feel will be very brief.  X-rays will be taken from different angles of each breast. The procedure may vary among doctors and hospitals. What happens after the procedure?  The mammogram will be read by a specialist (radiologist).  You may need to do certain parts of the test again. This depends on the quality of the images.  Ask when your test results will be ready. Make sure you get your test results.  You may go back to your normal activities. Summary  A mammogram is a low energy X-ray of the breasts that is done to check for abnormal changes. A man may have this test if he has a lump or swelling in his breast.  Before the procedure, tell your doctor about any breast problems that you have had in the past.  Have this test done about 1-2 weeks after your period.  For the test, each breast will be placed between two plastic or glass plates. The plates will press down on your breast for a few seconds.  The mammogram will be read by a specialist (radiologist). Ask when your test results will be ready. Make sure you get your test results. This information is not intended to replace advice given to you by your health care provider. Make sure you discuss any questions you have with your health care provider. Document Revised: 12/14/2017 Document  Reviewed: 12/14/2017 Elsevier Patient Education  [employer].

## 2020-03-27 NOTE — Progress Notes (Signed)
BP 130/78 (BP Location: Left Arm)   Pulse 87   Temp 98.3 F (36.8 C)   Ht '5\' 6"'  (1.676 m)   Wt 183 lb (83 kg)   LMP  (LMP Unknown)   SpO2 97%   BMI 29.54 kg/m    Subjective:    Patient ID: Maria Ball, female    DOB: 11-06-1960, 59 y.o.   MRN: [id number]  HPI: Maria Ball is a 59 y.o. female presenting on 03/27/2020 for comprehensive medical examination. Current medical complaints include:none  She currently lives with: self Menopausal Symptoms: no  Depression Screen done today and results listed below:  Depression screen [facility] 2/9 03/27/2020 03/26/2019 03/23/2018 03/21/2017 02/29/2016  Decreased Interest 0 0 0 0 0  Down, Depressed, Hopeless 0 0 0 0 0  PHQ - 2 Score 0 0 0 0 0  Altered sleeping - 0 0 0 -  Tired, decreased energy - 0 0 0 -  Change in appetite - 0 0 0 -  Feeling bad or failure about yourself  - 0 0 0 -  Trouble concentrating - 0 0 0 -  Moving slowly or fidgety/restless - 0 0 0 -  Suicidal thoughts - 0 0 0 -  PHQ-9 Score - 0 0 0 -  Difficult doing work/chores - - Not difficult at all - -    The patient does not have a history of falls. I did not complete a risk assessment for falls. A plan of care for falls was not documented.   Past Medical History:  Past Medical History:  Diagnosis Date  . Alkaline phosphatase elevation   . Depression   . Depression 12/11/2014  . DVT (deep venous thrombosis) (HCC)    while taking OCP in the 1980's  . Hypertension   . Ball   . Ball     Surgical History:  Past Surgical History:  Procedure Laterality Date  . COLONOSCOPY WITH PROPOFOL N/A 04/16/2018   Procedure: COLONOSCOPY WITH PROPOFOL;  Surgeon: Jonathon Bellows, MD;  Location: [facility] ENDOSCOPY;  Service: Gastroenterology;  Laterality: N/A;  . DILATION AND CURETTAGE OF UTERUS    . TONSILLECTOMY      Medications:  No current outpatient medications on file prior to visit.   No current facility-administered medications on file prior to visit.     Allergies:  No Known Allergies  Social History:  Social History   Socioeconomic History  . Marital status: Divorced    Spouse name: Not on file  . Number of children: Not on file  . Years of education: Not on file  . Highest education level: Not on file  Occupational History  . Not on file  Tobacco Use  . Smoking status: Never Smoker  . Smokeless tobacco: Never Used  Vaping Use  . Vaping Use: Never used  Substance and Sexual Activity  . Alcohol use: No    Alcohol/week: 0.0 standard drinks  . Drug use: No  . Sexual activity: Never  Other Topics Concern  . Not on file  Social History Narrative  . Not on file   Social Determinants of Health   Financial Resource Strain:   . Difficulty of Paying Living Expenses: Not on file  Food Insecurity:   . Worried About Charity fundraiser in the Last Year: Not on file  . Ran Out of Food in the Last Year: Not on file  Transportation Needs:   . Lack of Transportation (Medical): Not on file  . Lack of  Transportation (Non-Medical): Not on file  Physical Activity:   . Days of Exercise per Week: Not on file  . Minutes of Exercise per Session: Not on file  Stress:   . Feeling of Stress : Not on file  Social Connections:   . Frequency of Communication with Friends and Family: Not on file  . Frequency of Social Gatherings with Friends and Family: Not on file  . Attends Religious Services: Not on file  . Active Member of Clubs or Organizations: Not on file  . Attends Archivist Meetings: Not on file  . Marital Status: Not on file  Intimate Partner Violence:   . Fear of Current or Ex-Partner: Not on file  . Emotionally Abused: Not on file  . Physically Abused: Not on file  . Sexually Abused: Not on file   Social History   Tobacco Use  Smoking Status Never Smoker  Smokeless Tobacco Never Used   Social History   Substance and Sexual Activity  Alcohol Use No  . Alcohol/week: 0.0 standard drinks    Family  History:  Family History  Problem Relation Age of Onset  . Cancer Mother        colon-rectal  . Emphysema Father   . Cancer Sister        breast  . Hypertension Sister   . Diabetes Sister   . Breast cancer Sister 40  . Stroke Sister   . Hypertension Brother     Past medical history, surgical history, medications, allergies, family history and social history reviewed with patient today and changes made to appropriate areas of the chart.   Review of Systems - negative All other ROS negative except what is listed above and in the HPI.      Objective:    BP 130/78 (BP Location: Left Arm)   Pulse 87   Temp 98.3 F (36.8 C)   Ht '5\' 6"'  (1.676 m)   Wt 183 lb (83 kg)   LMP  (LMP Unknown)   SpO2 97%   BMI 29.54 kg/m   Wt Readings from Last 3 Encounters:  03/27/20 183 lb (83 kg)  03/26/19 183 lb (83 kg)  02/21/19 178 lb 6.4 oz (80.9 kg)    Physical Exam Vitals and nursing note reviewed.  Constitutional:      General: She is awake. She is not in acute distress.    Appearance: She is well-developed. She is not ill-appearing.  HENT:     Head: Normocephalic and atraumatic.     Right Ear: Hearing, tympanic membrane, ear canal and external ear normal. No drainage.     Left Ear: Hearing, tympanic membrane, ear canal and external ear normal. No drainage.     Nose: Nose normal.     Right Sinus: No maxillary sinus tenderness or frontal sinus tenderness.     Left Sinus: No maxillary sinus tenderness or frontal sinus tenderness.     Mouth/Throat:     Mouth: Mucous membranes are moist.     Pharynx: Oropharynx is clear. Uvula midline. No pharyngeal swelling, oropharyngeal exudate or posterior oropharyngeal erythema.  Eyes:     General: Lids are normal.        Right eye: No discharge.        Left eye: No discharge.     Extraocular Movements: Extraocular movements intact.     Conjunctiva/sclera: Conjunctivae normal.     Pupils: Pupils are equal, round, and reactive to light.      Visual Fields: Right  eye visual fields normal and left eye visual fields normal.  Neck:     Thyroid: No thyromegaly.     Vascular: No carotid bruit.     Trachea: Trachea normal.  Cardiovascular:     Rate and Rhythm: Normal rate and regular rhythm.     Heart sounds: Normal heart sounds. No murmur heard.  No gallop.   Pulmonary:     Effort: Pulmonary effort is normal. No accessory muscle usage or respiratory distress.     Breath sounds: Normal breath sounds.  Chest:     Breasts:        Right: Normal.        Left: Normal.  Abdominal:     General: Bowel sounds are normal.     Palpations: Abdomen is soft. There is no hepatomegaly or splenomegaly.     Tenderness: There is no abdominal tenderness.  Musculoskeletal:        General: Normal range of motion.     Cervical back: Normal range of motion and neck supple.     Right lower leg: No edema.     Left lower leg: No edema.  Lymphadenopathy:     Head:     Right side of head: No submental, submandibular, tonsillar, preauricular or posterior auricular adenopathy.     Left side of head: No submental, submandibular, tonsillar, preauricular or posterior auricular adenopathy.     Cervical: No cervical adenopathy.     Upper Body:     Right upper body: No supraclavicular, axillary or pectoral adenopathy.     Left upper body: No supraclavicular, axillary or pectoral adenopathy.  Skin:    General: Skin is warm and dry.     Capillary Refill: Capillary refill takes less than 2 seconds.     Findings: No rash.       Neurological:     Mental Status: She is alert and oriented to person, place, and time.     Cranial Nerves: Cranial nerves are intact.     Gait: Gait is intact.     Deep Tendon Reflexes: Reflexes are normal and symmetric.     Reflex Scores:      Brachioradialis reflexes are 2+ on the right side and 2+ on the left side.      Patellar reflexes are 2+ on the right side and 2+ on the left side. Psychiatric:        Attention and  Perception: Attention normal.        Mood and Affect: Mood normal.        Speech: Speech normal.        Behavior: Behavior normal. Behavior is cooperative.        Thought Content: Thought content normal.        Judgment: Judgment normal.     Results for orders placed or performed in visit on 03/26/19  CBC with Differential/Platelet out  Result Value Ref Range   WBC 6.1 3.4 - 10.8 x10E3/uL   RBC 4.71 3.77 - 5.28 x10E6/uL   Hemoglobin 13.1 11.1 - 15.9 g/dL   Hematocrit 40.4 34.0 - 46.6 %   MCV 86 79 - 97 fL   MCH 27.8 26.6 - 33.0 pg   MCHC 32.4 31 - 35 g/dL   RDW 13.6 11.7 - 15.4 %   Platelets 327 150 - 450 x10E3/uL   Neutrophils 58 Not Estab. %   Lymphs 32 Not Estab. %   Monocytes 8 Not Estab. %   Eos 1 Not Estab. %  Basos 1 Not Estab. %   Neutrophils Absolute 3.6 1.40 - 7.00 x10E3/uL   Lymphocytes Absolute 2.0 0 - 3 x10E3/uL   Monocytes Absolute 0.5 0 - 0 x10E3/uL   EOS (ABSOLUTE) 0.1 0.0 - 0.4 x10E3/uL   Basophils Absolute 0.1 0 - 0 x10E3/uL   Immature Granulocytes 0 Not Estab. %   Immature Grans (Abs) 0.0 0.0 - 0.1 x10E3/uL  Comprehensive metabolic panel  Result Value Ref Range   Glucose 72 65 - 99 mg/dL   BUN 9 6 - 24 mg/dL   Creatinine, Ser 0.63 0.57 - 1.00 mg/dL   GFR calc non Af Amer 98 >59 mL/min/1.73   GFR calc Af Amer 114 >59 mL/min/1.73   BUN/Creatinine Ratio 14 9 - 23   Sodium 144 134 - 144 mmol/L   Potassium 4.5 3.5 - 5.2 mmol/L   Chloride 105 96 - 106 mmol/L   CO2 26 20 - 29 mmol/L   Calcium 9.3 8.7 - 10.2 mg/dL   Total Protein 6.4 6.0 - 8.5 g/dL   Albumin 4.4 3.8 - 4.9 g/dL   Globulin, Total 2.0 1.5 - 4.5 g/dL   Albumin/Globulin Ratio 2.2 1.2 - 2.2   Bilirubin Total <0.2 0.0 - 1.2 mg/dL   Alkaline Phosphatase 150 (H) 39 - 117 IU/L   AST 17 0 - 40 IU/L   ALT 17 0 - 32 IU/L  Lipid Panel w/o Chol/HDL Ratio out  Result Value Ref Range   Cholesterol, Total 199 100 - 199 mg/dL   Triglycerides 190 (H) 0 - 149 mg/dL   HDL 39 (L) >39 mg/dL   VLDL  Cholesterol Cal 34 5 - 40 mg/dL   LDL Chol Calc (NIH) 126 (H) 0 - 99 mg/dL  TSH  Result Value Ref Range   TSH 1.160 0.450 - 4.500 uIU/mL  Vit D  25 hydroxy (rtn osteoporosis monitoring)  Result Value Ref Range   Vit D, 25-Hydroxy 23.8 (L) 30.0 - 100.0 ng/mL  Cytology - PAP  Result Value Ref Range   High risk HPV Negative    Adequacy      Satisfactory for evaluation; transformation zone component PRESENT.   Diagnosis      - Negative for intraepithelial lesion or malignancy (NILM)   Comment Normal Reference Range HPV - Negative       Assessment & Plan:   Problem List Items Addressed This Visit    None    Visit Diagnoses    Encounter for annual physical exam    -  Primary   Annual labs obtained today to include CBC, CMP, TSH, Lipid.   Relevant Orders   CBC with Differential/Platelet   Comprehensive metabolic panel   Screening cholesterol level       Lipid panel today.   Relevant Orders   Lipid Panel w/o Chol/HDL Ratio   Thyroid disorder screening       TSH for thyroid check today.   Relevant Orders   TSH   Encounter for screening mammogram for malignant neoplasm of breast       Mammogram ordered   Relevant Orders   MM DIGITAL SCREENING BILATERAL   Need for influenza vaccination       Flu vaccine today   Relevant Orders   Flu Vaccine QUAD 6+ mos PF IM (Fluarix Quad PF) (Completed)   Need for Ball vaccine       Shingrix #1 today -- number two due in 2 months.   Relevant Orders   Varicella-zoster vaccine IM (  Shingrix)       Follow up plan: Return in about 6 months (around 09/24/2020) for HTN & ALK PHOS CHECK LABS.   LABORATORY TESTING:  - Pap smear: up to date  IMMUNIZATIONS:   - Tdap: Tetanus vaccination status reviewed: last tetanus booster within 10 years. - Influenza: Up to date getting today - Pneumovax: Not applicable - Prevnar: Not applicable - HPV: Not applicable - Zostavax vaccine: Up to date -- getting dose # 1 today  SCREENING: -Mammogram:  Ordered today  - Colonoscopy: Up to date  - Bone Density: Not applicable  -Hearing Test: Not applicable  -Spirometry: Not applicable   PATIENT COUNSELING:   Advised to take 1 mg of folate supplement per day if capable of pregnancy.   Sexuality: Discussed sexually transmitted diseases, partner selection, use of condoms, avoidance of unintended pregnancy  and contraceptive alternatives.   Advised to avoid cigarette smoking.  I discussed with the patient that most people either abstain from alcohol or drink within safe limits (<=14/week and <=4 drinks/occasion for males, <=7/weeks and <= 3 drinks/occasion for females) and that the risk for alcohol disorders and other health effects rises proportionally with the number of drinks per week and how often a drinker exceeds daily limits.  Discussed cessation/primary prevention of drug use and availability of treatment for abuse.   Diet: Encouraged to adjust caloric intake to maintain  or achieve ideal body weight, to reduce intake of dietary saturated fat and total fat, to limit sodium intake by avoiding high sodium foods and not adding table salt, and to maintain adequate dietary potassium and calcium preferably from fresh fruits, vegetables, and low-fat dairy products.    Stressed the importance of regular exercise  Injury prevention: Discussed safety belts, safety helmets, smoke detector, smoking near bedding or upholstery.   Dental health: Discussed importance of regular tooth brushing, flossing, and dental visits.    NEXT PREVENTATIVE PHYSICAL DUE IN 1 YEAR. Return in about 6 months (around 09/24/2020) for HTN & ALK PHOS CHECK LABS.

## 2020-03-28 LAB — COMPREHENSIVE METABOLIC PANEL
ALT: 19 IU/L (ref 0–32)
AST: 15 IU/L (ref 0–40)
Albumin/Globulin Ratio: 1.9 (ref 1.2–2.2)
Albumin: 4.6 g/dL (ref 3.8–4.9)
Alkaline Phosphatase: 161 IU/L — ABNORMAL HIGH (ref 44–121)
BUN/Creatinine Ratio: 9 — ABNORMAL LOW (ref 12–28)
BUN: 6 mg/dL — ABNORMAL LOW (ref 8–27)
Bilirubin Total: 0.2 mg/dL (ref 0.0–1.2)
CO2: 23 mmol/L (ref 20–29)
Calcium: 9.6 mg/dL (ref 8.7–10.3)
Chloride: 101 mmol/L (ref 96–106)
Creatinine, Ser: 0.64 mg/dL (ref 0.57–1.00)
GFR calc Af Amer: 112 mL/min/{1.73_m2} (ref >59)
GFR calc non Af Amer: 97 mL/min/{1.73_m2} (ref >59)
Globulin, Total: 2.4 g/dL (ref 1.5–4.5)
Glucose: 85 mg/dL (ref 65–99)
Potassium: 4.2 mmol/L (ref 3.5–5.2)
Sodium: 141 mmol/L (ref 134–144)
Total Protein: 7 g/dL (ref 6.0–8.5)

## 2020-03-28 LAB — CBC WITH DIFFERENTIAL/PLATELET
Basophils Absolute: 0.1 10*3/uL (ref 0.0–0.2)
Basos: 1 %
EOS (ABSOLUTE): 0.1 10*3/uL (ref 0.0–0.4)
Eos: 1 %
Hematocrit: 41.6 % (ref 34.0–46.6)
Hemoglobin: 13.7 g/dL (ref 11.1–15.9)
Immature Grans (Abs): 0 10*3/uL (ref 0.0–0.1)
Immature Granulocytes: 0 %
Lymphocytes Absolute: 2.1 10*3/uL (ref 0.7–3.1)
Lymphs: 38 %
MCH: 27.5 pg (ref 26.6–33.0)
MCHC: 32.9 g/dL (ref 31.5–35.7)
MCV: 84 fL (ref 79–97)
Monocytes Absolute: 0.3 10*3/uL (ref 0.1–0.9)
Monocytes: 6 %
Neutrophils Absolute: 3 10*3/uL (ref 1.4–7.0)
Neutrophils: 54 %
Platelets: 364 10*3/uL (ref 150–450)
RBC: 4.98 x10E6/uL (ref 3.77–5.28)
RDW: 13 % (ref 11.7–15.4)
WBC: 5.5 10*3/uL (ref 3.4–10.8)

## 2020-03-28 LAB — LIPID PANEL W/O CHOL/HDL RATIO
Cholesterol, Total: 214 mg/dL — ABNORMAL HIGH (ref 100–199)
HDL: 44 mg/dL (ref >39)
LDL Chol Calc (NIH): 133 mg/dL — ABNORMAL HIGH (ref 0–99)
Triglycerides: 206 mg/dL — ABNORMAL HIGH (ref 0–149)
VLDL Cholesterol Cal: 37 mg/dL (ref 5–40)

## 2020-03-28 LAB — TSH: TSH: 1.21 u[IU]/mL (ref 0.450–4.500)

## 2020-03-29 NOTE — Progress Notes (Signed)
Contacted via MyChart The 10-year ASCVD risk score Mikey Bussing DC Jr., et al., 2013) is: 4.2%   Values used to calculate the score:     Age: 59 years     Sex: Female     Is Non-Hispanic African American: No     Diabetic: No     Tobacco smoker: No     Systolic Blood Pressure: 768 mmHg     Is BP treated: No     HDL Cholesterol: 44 mg/dL     Total Cholesterol: 214 mg/dL Good morning Maria Ball, your labs have returned.  Alkaline phosphatase remains slightly elevated per baseline, we will continue to monitor and next visit check GGT too to look at gall bladder.  No increase in baseline elevation.  Cholesterol levels remain elevated, but continue recommendation for focus on diet and regular exercise.  Thyroid level normal and CBC normal.  Kidney and liver function tests look great.  Any questions? Keep being awesome!!  Thank you for allowing me to participate in your care. Kindest regards, Shizuo Biskup

## 2020-04-27 ENCOUNTER — Ambulatory Visit
Admission: RE | Admit: 2020-04-27 | Discharge: 2020-04-27 | Disposition: A | Discharge status: Home / Self Care | Payer: BC Managed Care – PPO | Source: Ambulatory Visit | Attending: Nurse Practitioner | Admitting: Nurse Practitioner

## 2020-04-27 ENCOUNTER — Other Ambulatory Visit: Payer: Self-pay

## 2020-04-27 DIAGNOSIS — Z1231 Encounter for screening mammogram for malignant neoplasm of breast: Secondary | ICD-10-CM | POA: Diagnosis not present

## 2020-05-27 ENCOUNTER — Other Ambulatory Visit: Payer: Self-pay

## 2020-05-27 ENCOUNTER — Ambulatory Visit (INDEPENDENT_AMBULATORY_CARE_PROVIDER_SITE_OTHER): Payer: BC Managed Care – PPO

## 2020-05-27 DIAGNOSIS — Z23 Encounter for immunization: Secondary | ICD-10-CM | POA: Diagnosis not present

## 2020-09-24 ENCOUNTER — Ambulatory Visit: Payer: BC Managed Care – PPO | Admitting: Nurse Practitioner

## 2021-02-11 ENCOUNTER — Ambulatory Visit: Payer: BC Managed Care – PPO | Admitting: Dermatology

## 2021-02-15 ENCOUNTER — Encounter: Payer: Self-pay | Admitting: General Surgery

## 2021-02-17 ENCOUNTER — Other Ambulatory Visit: Payer: Self-pay

## 2021-02-17 ENCOUNTER — Ambulatory Visit: Payer: BC Managed Care – PPO | Admitting: Dermatology

## 2021-02-17 ENCOUNTER — Encounter: Payer: Self-pay | Admitting: Dermatology

## 2021-02-17 DIAGNOSIS — L820 Inflamed seborrheic keratosis: Secondary | ICD-10-CM

## 2021-02-17 DIAGNOSIS — D485 Neoplasm of uncertain behavior of skin: Secondary | ICD-10-CM

## 2021-02-17 DIAGNOSIS — C4491 Basal cell carcinoma of skin, unspecified: Secondary | ICD-10-CM

## 2021-02-17 DIAGNOSIS — C44310 Basal cell carcinoma of skin of unspecified parts of face: Secondary | ICD-10-CM

## 2021-02-17 DIAGNOSIS — L578 Other skin changes due to chronic exposure to nonionizing radiation: Secondary | ICD-10-CM

## 2021-02-17 DIAGNOSIS — C44319 Basal cell carcinoma of skin of other parts of face: Secondary | ICD-10-CM

## 2021-02-17 DIAGNOSIS — L814 Other melanin hyperpigmentation: Secondary | ICD-10-CM

## 2021-02-17 DIAGNOSIS — L821 Other seborrheic keratosis: Secondary | ICD-10-CM

## 2021-02-17 HISTORY — DX: Basal cell carcinoma of skin, unspecified: C44.91

## 2021-02-17 NOTE — Patient Instructions (Addendum)
Electrodesiccation and Curettage ("Scrape and Burn") Wound Care Instructions  Leave the original bandage on for 24 hours if possible.  If the bandage becomes soaked or soiled before that time, it is OK to remove it and examine the wound.  A small amount of post-operative bleeding is normal.  If excessive bleeding occurs, remove the bandage, place gauze over the site and apply continuous pressure (no peeking) over the area for 30 minutes. If this does not work, please call our clinic as soon as possible or page your doctor if it is after hours.   Once a day, cleanse the wound with soap and water. It is fine to shower. If a thick crust develops you may use a Q-tip dipped into dilute hydrogen peroxide (mix 1:1 with water) to dissolve it.  Hydrogen peroxide can slow the healing process, so use it only as needed.    After washing, apply petroleum jelly (Vaseline) or an antibiotic ointment if your doctor prescribed one for you, followed by a bandage.    For best healing, the wound should be covered with a layer of ointment at all times. If you are not able to keep the area covered with a bandage to hold the ointment in place, this may mean re-applying the ointment several times a day.  Continue this wound care until the wound has healed and is no longer open. It may take several weeks for the wound to heal and close.  Itching and mild discomfort is normal during the healing process.  If you have any discomfort, you can take Tylenol (acetaminophen) or ibuprofen as directed on the bottle. (Please do not take these if you have an allergy to them or cannot take them for another reason).  Some redness, tenderness and white or yellow material in the wound is normal healing.  If the area becomes very sore and red, or develops a thick yellow-green material (pus), it may be infected; please notify us.    Wound healing continues for up to one year following surgery. It is not unusual to experience pain in the scar  from time to time during the interval.  If the pain becomes severe or the scar thickens, you should notify the office.    A slight amount of redness in a scar is expected for the first six months.  After six months, the redness will fade and the scar will soften and fade.  The color difference becomes less noticeable with time.  If there are any problems, return for a post-op surgery check at your earliest convenience.  To improve the appearance of the scar, you can use silicone scar gel, cream, or sheets (such as Mederma or Serica) every night for up to one year. These are available over the counter (without a prescription).  Please call our office at [phone] for any questions or concerns.  If you have any questions or concerns for your doctor, please call our main line at [phone] and press option 4 to reach your doctor's medical assistant. If no one answers, please leave a voicemail as directed and we will return your call as soon as possible. Messages left after 4 pm will be answered the following business day.   You may also send [place] a message via [place]. We typically respond to MyChart messages within 1-2 business days.  For prescription refills, please ask your pharmacy to contact our office. Our fax number is [phone].  If you have an urgent issue when the clinic is closed that  cannot wait until the next business day, you can page your doctor at the number below.    Please note that while we do our best to be available for urgent issues outside of office hours, we are not available 24/7.   If you have an urgent issue and are unable to reach [place], you may choose to seek medical care at your doctor's office, retail clinic, urgent care center, or emergency room.  If you have a medical emergency, please immediately call 911 or go to the emergency department.  Pager Numbers  - Dr. Nehemiah Massed: [phone]  - Dr. Laurence Ferrari: [phone]  - Dr. Nicole Kindred: [phone]  In the event  of inclement weather, please call our main line at [phone] for an update on the status of any delays or closures.  Dermatology Medication Tips: Please keep the boxes that topical medications come in in order to help keep track of the instructions about where and how to use these. Pharmacies typically print the medication instructions only on the boxes and not directly on the medication tubes.   If your medication is too expensive, please contact our office at [phone] option 4 or send [place] a message through [place].   We are unable to tell what your co-pay for medications will be in advance as this is different depending on your insurance coverage. However, we may be able to find a substitute medication at lower cost or fill out paperwork to get insurance to cover a needed medication.   If a prior authorization is required to get your medication covered by your insurance company, please allow [place] 1-2 business days to complete this process.  Drug prices often vary depending on where the prescription is filled and some pharmacies may offer cheaper prices.  The website www.goodrx.com contains coupons for medications through different pharmacies. The prices here do not account for what the cost may be with help from insurance (it may be cheaper with your insurance), but the website can give you the price if you did not use any insurance.  - You can print the associated coupon and take it with your prescription to the pharmacy.  - You may also stop by our office during regular business hours and pick up a GoodRx coupon card.  - If you need your prescription sent electronically to a different pharmacy, notify our office through [facility] or by phone at [phone] option 4.

## 2021-02-17 NOTE — Progress Notes (Signed)
New Patient Visit  Subjective  Maria Ball is a 60 y.o. female who presents for the following: irregular skin lesions (On the forehead which has been there since Jan 2022 and not resolved. She also has lesions on her L arm and face that she would like checked today.).  The following portions of the chart were reviewed this encounter and updated as appropriate:   Tobacco  Allergies  Meds  Problems  Med Hx  Surg Hx  Fam Hx     Review of Systems:  No other skin or systemic complaints except as noted in HPI or Assessment and Plan.  Objective  Well appearing patient in no apparent distress; mood and affect are within normal limits.  A focused examination was performed including the face, trunk, extremities. Relevant physical exam findings are noted in the Assessment and Plan.  L of midline sup forehead 0.6 cm pearly pink papule   Left Forearm - Anterior Erythematous keratotic or waxy stuck-on papule or plaque.    Assessment & Plan  Neoplasm of uncertain behavior of skin L of midline sup forehead  Epidermal / dermal shaving  Lesion diameter (cm):  0.6 Informed consent: discussed and consent obtained   Timeout: patient name, date of birth, surgical site, and procedure verified   Procedure prep:  Patient was prepped and draped in usual sterile fashion Prep type:  Isopropyl alcohol Anesthesia: the lesion was anesthetized in a standard fashion   Anesthetic:  1% lidocaine w/ epinephrine 1-100,000 buffered w/ 8.4% NaHCO3 Instrument used: flexible razor blade   Hemostasis achieved with: pressure, aluminum chloride and electrodesiccation   Outcome: patient tolerated procedure well   Post-procedure details: sterile dressing applied and wound care instructions given   Dressing type: bandage and petrolatum    Destruction of lesion Complexity: extensive   Destruction method: electrodesiccation and curettage   Informed consent: discussed and consent obtained   Timeout:   patient name, date of birth, surgical site, and procedure verified Procedure prep:  Patient was prepped and draped in usual sterile fashion Prep type:  Isopropyl alcohol Anesthesia: the lesion was anesthetized in a standard fashion   Anesthetic:  1% lidocaine w/ epinephrine 1-100,000 buffered w/ 8.4% NaHCO3 Curettage performed in three different directions: Yes   Electrodesiccation performed over the curetted area: Yes   Final wound size (cm):  1.1 Hemostasis achieved with:  pressure, aluminum chloride and electrodesiccation Outcome: patient tolerated procedure well with no complications   Post-procedure details: sterile dressing applied and wound care instructions given   Dressing type: bandage and petrolatum    Specimen 1 - Surgical pathology Differential Diagnosis: D48.5 r/o BCC ED&C  Check Margins: No  Inflamed seborrheic keratosis Left Forearm - Anterior  Destruction of lesion - Left Forearm - Anterior Complexity: simple   Destruction method: cryotherapy   Informed consent: discussed and consent obtained   Timeout:  patient name, date of birth, surgical site, and procedure verified Lesion destroyed using liquid nitrogen: Yes   Region frozen until ice ball extended beyond lesion: Yes   Outcome: patient tolerated procedure well with no complications   Post-procedure details: wound care instructions given    Actinic Damage - chronic, secondary to cumulative UV radiation exposure/sun exposure over time - diffuse scaly erythematous macules with underlying dyspigmentation - Recommend daily broad spectrum sunscreen SPF 30+ to sun-exposed areas, reapply every 2 hours as needed.  - Recommend staying in the shade or wearing long sleeves, sun glasses (UVA+UVB protection) and wide brim hats (4-inch brim  around the entire circumference of the hat). - Call for new or changing lesions.  Seborrheic Keratoses - Stuck-on, waxy, tan-brown papules and/or plaques  - Benign-appearing -  Discussed benign etiology and prognosis. - Observe - Call for any changes  Lentigines - Scattered tan macules - Due to sun exposure - Benign-appering, observe - Recommend daily broad spectrum sunscreen SPF 30+ to sun-exposed areas, reapply every 2 hours as needed. - Call for any changes  Return for TBSE in 4-6 mths.  Luther Redo, CMA, am acting as scribe for Sarina Ser, MD .  Documentation: I have reviewed the above documentation for accuracy and completeness, and I agree with the above.  Sarina Ser, MD

## 2021-02-19 ENCOUNTER — Telehealth: Payer: Self-pay

## 2021-02-19 NOTE — Telephone Encounter (Signed)
-----   Message from Ralene Bathe, MD sent at 02/18/2021  5:51 PM EDT ----- Diagnosis Skin , left of midline sup forehead BASAL CELL CARCINOMA WITH FOCAL SCLEROSIS, SEE DESCRIPTION  Cancer - BCC As suspected Already treated Recheck next visit

## 2021-02-19 NOTE — Telephone Encounter (Signed)
Patient informed of pathology results 

## 2021-03-28 DIAGNOSIS — E7800 Pure hypercholesterolemia, unspecified: Secondary | ICD-10-CM | POA: Insufficient documentation

## 2021-03-29 ENCOUNTER — Other Ambulatory Visit: Payer: Self-pay

## 2021-03-29 ENCOUNTER — Ambulatory Visit (INDEPENDENT_AMBULATORY_CARE_PROVIDER_SITE_OTHER): Payer: BC Managed Care – PPO | Admitting: Nurse Practitioner

## 2021-03-29 ENCOUNTER — Encounter: Payer: Self-pay | Admitting: Nurse Practitioner

## 2021-03-29 VITALS — BP 123/73 | HR 82 | Temp 98.1°F | Ht 67.0 in | Wt 194.4 lb

## 2021-03-29 DIAGNOSIS — E559 Vitamin D deficiency, unspecified: Secondary | ICD-10-CM | POA: Insufficient documentation

## 2021-03-29 DIAGNOSIS — Z1211 Encounter for screening for malignant neoplasm of colon: Secondary | ICD-10-CM | POA: Diagnosis not present

## 2021-03-29 DIAGNOSIS — E7800 Pure hypercholesterolemia, unspecified: Secondary | ICD-10-CM

## 2021-03-29 DIAGNOSIS — R748 Abnormal levels of other serum enzymes: Secondary | ICD-10-CM

## 2021-03-29 DIAGNOSIS — Z23 Encounter for immunization: Secondary | ICD-10-CM

## 2021-03-29 DIAGNOSIS — I10 Essential (primary) hypertension: Secondary | ICD-10-CM

## 2021-03-29 DIAGNOSIS — Z6830 Body mass index (BMI) 30.0-30.9, adult: Secondary | ICD-10-CM

## 2021-03-29 DIAGNOSIS — Z1231 Encounter for screening mammogram for malignant neoplasm of breast: Secondary | ICD-10-CM

## 2021-03-29 DIAGNOSIS — E6609 Other obesity due to excess calories: Secondary | ICD-10-CM

## 2021-03-29 DIAGNOSIS — Z0000 Encounter for general adult medical examination without abnormal findings: Secondary | ICD-10-CM

## 2021-03-29 NOTE — Patient Instructions (Signed)

## 2021-03-29 NOTE — Assessment & Plan Note (Signed)
BMI 30.45.  Recommended eating smaller high protein, low fat meals more frequently and exercising 30 mins a day 5 times a week with a goal of 10-15lb weight loss in the next 3 months. Patient voiced their understanding and motivation to adhere to these recommendations.  

## 2021-03-29 NOTE — Assessment & Plan Note (Addendum)
Ongoing, stable with diet changes.  BP below goal.  No medications at this time.  Continue to monitor and initiate as needed.  Labs today.

## 2021-03-29 NOTE — Progress Notes (Signed)
BP 123/73   Pulse 82   Temp 98.1 F (36.7 C) (Oral)   Ht 5\' 7"  (1.702 m)   Wt 194 lb 6.4 oz (88.2 kg)   LMP  (LMP Unknown)   SpO2 97%   BMI 30.45 kg/m    Subjective:    Patient ID: Maria Ball, female    DOB: 04/15/1961, 60 y.o.   MRN: [id number]  HPI: Maria Ball is a 60 y.o. female presenting on 03/29/2021 for comprehensive medical examination. Current medical complaints include:none  She currently lives with: self Menopausal Symptoms: no  The 10-year ASCVD risk score (Arnett DK, et al., 2019) is: 4.1%   Values used to calculate the score:     Age: 16 years     Sex: Female     Is Non-Hispanic African American: No     Diabetic: No     Tobacco smoker: No     Systolic Blood Pressure: 248 mmHg     Is BP treated: No     HDL Cholesterol: 44 mg/dL     Total Cholesterol: 214 mg/dL  Depression Screen done today and results listed below:  Depression screen [facility] 2/9 03/29/2021 03/27/2020 03/26/2019 03/23/2018 03/21/2017  Decreased Interest 0 0 0 0 0  Down, Depressed, Hopeless 0 0 0 0 0  PHQ - 2 Score 0 0 0 0 0  Altered sleeping 0 - 0 0 0  Tired, decreased energy 0 - 0 0 0  Change in appetite 0 - 0 0 0  Feeling bad or failure about yourself  0 - 0 0 0  Trouble concentrating 0 - 0 0 0  Moving slowly or fidgety/restless 0 - 0 0 0  Suicidal thoughts 0 - 0 0 0  PHQ-9 Score 0 - 0 0 0  Difficult doing work/chores Not difficult at all - - Not difficult at all -   Fall Risk 04/27/2018 02/10/2019 02/21/2019 03/26/2019 03/29/2021  Falls in the past year? 0 - 0 0 0  Was there an injury with Fall? - - - 0 0  Fall Risk Category Calculator - - - 0 0  Fall Risk Category - - - Low Low  Patient Fall Risk Level Low fall risk Low fall risk - Low fall risk Low fall risk  Patient at Risk for Falls Due to - - - - No Fall Risks  Fall risk Follow up Falls evaluation completed - - - Falls evaluation completed    Functional Status Survey: Is the patient deaf or have difficulty  hearing?: No Does the patient have difficulty seeing, even when wearing glasses/contacts?: No Does the patient have difficulty concentrating, remembering, or making decisions?: No Does the patient have difficulty walking or climbing stairs?: No Does the patient have difficulty dressing or bathing?: No Does the patient have difficulty doing errands alone such as visiting a doctor's office or shopping?: No   Past Medical History:  Past Medical History:  Diagnosis Date   Alkaline phosphatase elevation    Basal cell carcinoma 02/17/2021   L of midline sup forehead - ED&C   Depression    Depression 12/11/2014   DVT (deep venous thrombosis) ([place])    while taking OCP in the 1980's   Hypertension    Ball    Ball     Surgical History:  Past Surgical History:  Procedure Laterality Date   COLONOSCOPY WITH PROPOFOL N/A 04/16/2018   Procedure: COLONOSCOPY WITH PROPOFOL;  Surgeon: Jonathon Bellows, MD;  Location: [facility] ENDOSCOPY;  Service: Gastroenterology;  Laterality: N/A;   DILATION AND CURETTAGE OF UTERUS     TONSILLECTOMY      Medications:  Current Outpatient Medications on File Prior to Visit  Medication Sig   Cholecalciferol (VITAMIN D3) 25 MCG (1000 UT) CAPS Take 2 capsules by mouth daily.   valACYclovir (VALTREX) 1000 MG tablet Take 1,000 mg by mouth daily.   No current facility-administered medications on file prior to visit.    Allergies:  No Known Allergies  Social History:  Social History   Socioeconomic History   Marital status: Divorced    Spouse name: Not on file   Number of children: Not on file   Years of education: Not on file   Highest education level: Not on file  Occupational History   Not on file  Tobacco Use   Smoking status: Never   Smokeless tobacco: Never  Vaping Use   Vaping Use: Never used  Substance and Sexual Activity   Alcohol use: No    Alcohol/week: 0.0 standard drinks   Drug use: No   Sexual activity: Never  Other Topics Concern    Not on file  Social History Narrative   Not on file   Social Determinants of Health   Financial Resource Strain: Not on file  Food Insecurity: Not on file  Transportation Needs: Not on file  Physical Activity: Not on file  Stress: Not on file  Social Connections: Not on file  Intimate Partner Violence: Not on file   Social History   Tobacco Use  Smoking Status Never  Smokeless Tobacco Never   Social History   Substance and Sexual Activity  Alcohol Use No   Alcohol/week: 0.0 standard drinks    Family History:  Family History  Problem Relation Age of Onset   Cancer Mother        colon-rectal   Emphysema Father    Cancer Sister        breast   Hypertension Sister    Diabetes Sister    Breast cancer Sister 46   Stroke Sister    Hypertension Brother     Past medical history, surgical history, medications, allergies, family history and social history reviewed with patient today and changes made to appropriate areas of the chart.   Review of Systems - negative All other ROS negative except what is listed above and in the HPI.      Objective:    BP 123/73   Pulse 82   Temp 98.1 F (36.7 C) (Oral)   Ht 5\' 7"  (1.702 m)   Wt 194 lb 6.4 oz (88.2 kg)   LMP  (LMP Unknown)   SpO2 97%   BMI 30.45 kg/m   Wt Readings from Last 3 Encounters:  03/29/21 194 lb 6.4 oz (88.2 kg)  03/27/20 183 lb (83 kg)  03/26/19 183 lb (83 kg)    Physical Exam Vitals and nursing note reviewed. Exam conducted with a chaperone present.  Constitutional:      General: She is awake. She is not in acute distress.    Appearance: She is well-developed. She is not ill-appearing.  HENT:     Head: Normocephalic and atraumatic.     Right Ear: Hearing, tympanic membrane, ear canal and external ear normal. No drainage.     Left Ear: Hearing, tympanic membrane, ear canal and external ear normal. No drainage.     Nose: Nose normal.     Right Sinus: No maxillary sinus tenderness or frontal sinus  tenderness.     Left Sinus: No maxillary sinus tenderness or frontal sinus tenderness.     Mouth/Throat:     Mouth: Mucous membranes are moist.     Pharynx: Oropharynx is clear. Uvula midline. No pharyngeal swelling, oropharyngeal exudate or posterior oropharyngeal erythema.  Eyes:     General: Lids are normal.        Right eye: No discharge.        Left eye: No discharge.     Extraocular Movements: Extraocular movements intact.     Conjunctiva/sclera: Conjunctivae normal.     Pupils: Pupils are equal, round, and reactive to light.     Visual Fields: Right eye visual fields normal and left eye visual fields normal.  Neck:     Thyroid: No thyromegaly.     Vascular: No carotid bruit.     Trachea: Trachea normal.  Cardiovascular:     Rate and Rhythm: Normal rate and regular rhythm.     Heart sounds: Normal heart sounds. No murmur heard.   No gallop.  Pulmonary:     Effort: Pulmonary effort is normal. No accessory muscle usage or respiratory distress.     Breath sounds: Normal breath sounds.  Chest:  Breasts:    Right: Normal.     Left: Normal.  Abdominal:     General: Bowel sounds are normal.     Palpations: Abdomen is soft. There is no hepatomegaly or splenomegaly.     Tenderness: There is no abdominal tenderness.  Musculoskeletal:        General: Normal range of motion.     Cervical back: Normal range of motion and neck supple.     Right lower leg: No edema.     Left lower leg: No edema.  Lymphadenopathy:     Head:     Right side of head: No submental, submandibular, tonsillar, preauricular or posterior auricular adenopathy.     Left side of head: No submental, submandibular, tonsillar, preauricular or posterior auricular adenopathy.     Cervical: No cervical adenopathy.     Upper Body:     Right upper body: No supraclavicular, axillary or pectoral adenopathy.     Left upper body: No supraclavicular, axillary or pectoral adenopathy.  Skin:    General: Skin is warm and  dry.     Capillary Refill: Capillary refill takes less than 2 seconds.     Findings: No rash.  Neurological:     Mental Status: She is alert and oriented to person, place, and time.     Gait: Gait is intact.     Deep Tendon Reflexes: Reflexes are normal and symmetric.     Reflex Scores:      Brachioradialis reflexes are 2+ on the right side and 2+ on the left side.      Patellar reflexes are 2+ on the right side and 2+ on the left side. Psychiatric:        Attention and Perception: Attention normal.        Mood and Affect: Mood normal.        Speech: Speech normal.        Behavior: Behavior normal. Behavior is cooperative.        Thought Content: Thought content normal.        Judgment: Judgment normal.   Results for orders placed or performed in visit on 03/27/20  CBC with Differential/Platelet  Result Value Ref Range   WBC 5.5 3.4 - 10.8 x10E3/uL   RBC 4.98  3.77 - 5.28 x10E6/uL   Hemoglobin 13.7 11.1 - 15.9 g/dL   Hematocrit 41.6 34.0 - 46.6 %   MCV 84 79 - 97 fL   MCH 27.5 26.6 - 33.0 pg   MCHC 32.9 31.5 - 35.7 g/dL   RDW 13.0 11.7 - 15.4 %   Platelets 364 150 - 450 x10E3/uL   Neutrophils 54 Not Estab. %   Lymphs 38 Not Estab. %   Monocytes 6 Not Estab. %   Eos 1 Not Estab. %   Basos 1 Not Estab. %   Neutrophils Absolute 3.0 1.4 - 7.0 x10E3/uL   Lymphocytes Absolute 2.1 0.7 - 3.1 x10E3/uL   Monocytes Absolute 0.3 0.1 - 0.9 x10E3/uL   EOS (ABSOLUTE) 0.1 0.0 - 0.4 x10E3/uL   Basophils Absolute 0.1 0.0 - 0.2 x10E3/uL   Immature Granulocytes 0 Not Estab. %   Immature Grans (Abs) 0.0 0.0 - 0.1 x10E3/uL  Comprehensive metabolic panel  Result Value Ref Range   Glucose 85 65 - 99 mg/dL   BUN 6 (L) 8 - 27 mg/dL   Creatinine, Ser 0.64 0.57 - 1.00 mg/dL   GFR calc non Af Amer 97 >59 mL/min/1.73   GFR calc Af Amer 112 >59 mL/min/1.73   BUN/Creatinine Ratio 9 (L) 12 - 28   Sodium 141 134 - 144 mmol/L   Potassium 4.2 3.5 - 5.2 mmol/L   Chloride 101 96 - 106 mmol/L   CO2 23  20 - 29 mmol/L   Calcium 9.6 8.7 - 10.3 mg/dL   Total Protein 7.0 6.0 - 8.5 g/dL   Albumin 4.6 3.8 - 4.9 g/dL   Globulin, Total 2.4 1.5 - 4.5 g/dL   Albumin/Globulin Ratio 1.9 1.2 - 2.2   Bilirubin Total 0.2 0.0 - 1.2 mg/dL   Alkaline Phosphatase 161 (H) 44 - 121 IU/L   AST 15 0 - 40 IU/L   ALT 19 0 - 32 IU/L  Lipid Panel w/o Chol/HDL Ratio  Result Value Ref Range   Cholesterol, Total 214 (H) 100 - 199 mg/dL   Triglycerides 206 (H) 0 - 149 mg/dL   HDL 44 >39 mg/dL   VLDL Cholesterol Cal 37 5 - 40 mg/dL   LDL Chol Calc (NIH) 133 (H) 0 - 99 mg/dL  TSH  Result Value Ref Range   TSH 1.210 0.450 - 4.500 uIU/mL      Assessment & Plan:   Problem List Items Addressed This Visit       Cardiovascular and Mediastinum   Hypertension - Primary    Ongoing, stable with diet changes.  BP below goal.  No medications at this time.  Continue to monitor and initiate as needed.  Labs today.      Relevant Orders   CBC with Differential/Platelet   Comprehensive metabolic panel   TSH     Other   Elevated low density lipoprotein (LDL) cholesterol level    Chronic, noted on past labs with ASCVD 4.1%.  Continue without medication at this time and focus on diet and exercise.  Lipid panel today.      Relevant Orders   Lipid Panel w/o Chol/HDL Ratio   Obesity    BMI 30.45.  Recommended eating smaller high protein, low fat meals more frequently and exercising 30 mins a day 5 times a week with a goal of 10-15lb weight loss in the next 3 months. Patient voiced their understanding and motivation to adhere to these recommendations.       Relevant Orders  TSH   Vitamin D deficiency    Chronic, stable, continue supplement daily and adjust as needed.  Check level today.      Relevant Orders   VITAMIN D 25 Hydroxy (Vit-D Deficiency, Fractures)   Other Visit Diagnoses     Elevated alkaline phosphatase level       Noted past labs, check GGT and CMP today, consider ultrasound in future if  elevation.   Relevant Orders   Comprehensive metabolic panel   Gamma GT   Encounter for annual physical exam       Annual physical with labs today.  Health maintenance reviewed, flu shot provided and mammogram + colonoscopy referral placed.   Encounter for screening mammogram for malignant neoplasm of breast       Mammogram ordered   Relevant Orders   MM 3D SCREEN BREAST BILATERAL   Colon cancer screening       Gi referral placed.   Relevant Orders   Ambulatory referral to Gastroenterology   Flu vaccine need       Flu vaccine today.   Relevant Orders   Flu Vaccine QUAD 6+ mos PF IM (Fluarix Quad PF) (Completed)        Follow up plan: Return in about 1 year (around 03/29/2022) for Annual physical.   LABORATORY TESTING:  - Pap smear: up to date  IMMUNIZATIONS:   - Tdap: Tetanus vaccination status reviewed: last tetanus booster within 10 years. - Influenza: Up to date  - Pneumovax: Not applicable - Prevnar: Not applicable - HPV: Not applicable - Zostavax vaccine: Up to date   SCREENING: -Mammogram: Ordered today  - Colonoscopy: Ordered today - Bone Density: Not applicable  -Hearing Test: Not applicable  -Spirometry: Not applicable   PATIENT COUNSELING:   Advised to take 1 mg of folate supplement per day if capable of pregnancy.   Sexuality: Discussed sexually transmitted diseases, partner selection, use of condoms, avoidance of unintended pregnancy  and contraceptive alternatives.   Advised to avoid cigarette smoking.  I discussed with the patient that most people either abstain from alcohol or drink within safe limits (<=14/week and <=4 drinks/occasion for males, <=7/weeks and <= 3 drinks/occasion for females) and that the risk for alcohol disorders and other health effects rises proportionally with the number of drinks per week and how often a drinker exceeds daily limits.  Discussed cessation/primary prevention of drug use and availability of treatment for abuse.    Diet: Encouraged to adjust caloric intake to maintain  or achieve ideal body weight, to reduce intake of dietary saturated fat and total fat, to limit sodium intake by avoiding high sodium foods and not adding table salt, and to maintain adequate dietary potassium and calcium preferably from fresh fruits, vegetables, and low-fat dairy products.    Stressed the importance of regular exercise  Injury prevention: Discussed safety belts, safety helmets, smoke detector, smoking near bedding or upholstery.   Dental health: Discussed importance of regular tooth brushing, flossing, and dental visits.    NEXT PREVENTATIVE PHYSICAL DUE IN 1 YEAR. Return in about 1 year (around 03/29/2022) for Annual physical.

## 2021-03-29 NOTE — Assessment & Plan Note (Addendum)
Chronic, noted on past labs with ASCVD 4.1%.  Continue without medication at this time and focus on diet and exercise.  Lipid panel today.

## 2021-03-29 NOTE — Assessment & Plan Note (Signed)
Chronic, stable, continue supplement daily and adjust as needed.  Check level today. 

## 2021-03-30 LAB — CBC WITH DIFFERENTIAL/PLATELET
Basophils Absolute: 0 10*3/uL (ref 0.0–0.2)
Basos: 1 %
EOS (ABSOLUTE): 0.1 10*3/uL (ref 0.0–0.4)
Eos: 1 %
Hematocrit: 41.6 % (ref 34.0–46.6)
Hemoglobin: 13.4 g/dL (ref 11.1–15.9)
Immature Grans (Abs): 0 10*3/uL (ref 0.0–0.1)
Immature Granulocytes: 0 %
Lymphocytes Absolute: 2.6 10*3/uL (ref 0.7–3.1)
Lymphs: 36 %
MCH: 27 pg (ref 26.6–33.0)
MCHC: 32.2 g/dL (ref 31.5–35.7)
MCV: 84 fL (ref 79–97)
Monocytes Absolute: 0.5 10*3/uL (ref 0.1–0.9)
Monocytes: 6 %
Neutrophils Absolute: 4 10*3/uL (ref 1.4–7.0)
Neutrophils: 56 %
Platelets: 366 10*3/uL (ref 150–450)
RBC: 4.96 x10E6/uL (ref 3.77–5.28)
RDW: 13.2 % (ref 11.7–15.4)
WBC: 7.2 10*3/uL (ref 3.4–10.8)

## 2021-03-30 LAB — COMPREHENSIVE METABOLIC PANEL
ALT: 22 IU/L (ref 0–32)
AST: 18 IU/L (ref 0–40)
Albumin/Globulin Ratio: 2 (ref 1.2–2.2)
Albumin: 4.6 g/dL (ref 3.8–4.8)
Alkaline Phosphatase: 165 IU/L — ABNORMAL HIGH (ref 44–121)
BUN/Creatinine Ratio: 14 (ref 12–28)
BUN: 9 mg/dL (ref 8–27)
Bilirubin Total: 0.2 mg/dL (ref 0.0–1.2)
CO2: 25 mmol/L (ref 20–29)
Calcium: 9.3 mg/dL (ref 8.7–10.3)
Chloride: 100 mmol/L (ref 96–106)
Creatinine, Ser: 0.65 mg/dL (ref 0.57–1.00)
Globulin, Total: 2.3 g/dL (ref 1.5–4.5)
Glucose: 89 mg/dL (ref 70–99)
Potassium: 4.6 mmol/L (ref 3.5–5.2)
Sodium: 138 mmol/L (ref 134–144)
Total Protein: 6.9 g/dL (ref 6.0–8.5)
eGFR: 100 mL/min/{1.73_m2} (ref >59)

## 2021-03-30 LAB — LIPID PANEL W/O CHOL/HDL RATIO
Cholesterol, Total: 185 mg/dL (ref 100–199)
HDL: 40 mg/dL (ref >39)
LDL Chol Calc (NIH): 106 mg/dL — ABNORMAL HIGH (ref 0–99)
Triglycerides: 224 mg/dL — ABNORMAL HIGH (ref 0–149)
VLDL Cholesterol Cal: 39 mg/dL (ref 5–40)

## 2021-03-30 LAB — GAMMA GT: GGT: 18 IU/L (ref 0–60)

## 2021-03-30 LAB — TSH: TSH: 1.22 u[IU]/mL (ref 0.450–4.500)

## 2021-03-30 LAB — VITAMIN D 25 HYDROXY (VIT D DEFICIENCY, FRACTURES): Vit D, 25-Hydroxy: 28.3 ng/mL — ABNORMAL LOW (ref 30.0–100.0)

## 2021-03-30 NOTE — Progress Notes (Signed)
Contacted via MyChart The 10-year ASCVD risk score (Arnett DK, et al., 2019) is: 3.9%   Values used to calculate the score:     Age: 60 years     Sex: Female     Is Non-Hispanic African American: No     Diabetic: No     Tobacco smoker: No     Systolic Blood Pressure: 416 mmHg     Is BP treated: No     HDL Cholesterol: 40 mg/dL     Total Cholesterol: 185 mg/dL   Good afternoon Manjot, your labs have returned and overall remain stable.  Alkaline phosphatase remains a bit elevated, but GGT normal.  This could be bone or gall bladder related and we will continue to monitor.  Vitamin D remains slightly on low side, continue supplement daily.  Your LDL is above normal. The LDL is the bad cholesterol. Over time and in combination with inflammation and other factors, this contributes to plaque which in turn may lead to stroke and/or heart attack down the road. Sometimes high LDL is primarily genetic, and people might be eating all the right foods but still have high numbers. Other times, there is room for improvement in one's diet and eating healthier can bring this number down and potentially reduce one's risk of heart attack and/or stroke.   To reduce your LDL, Remember - more fruits and vegetables, more fish, and limit red meat and dairy products. More soy, nuts, beans, barley, lentils, oats and plant sterol ester enriched margarine instead of butter. I also encourage eliminating sugar and processed food. Remember, shop on the outside of the grocery store and visit your [employer]. If you would like to talk with me about dietary changes for your cholesterol, please let me know. We should recheck your cholesterol in 12 months.  Any questions? Keep being amazing!!  Thank you for allowing me to participate in your care.  I appreciate you. Kindest regards, Renay Crammer

## 2021-04-05 ENCOUNTER — Telehealth: Payer: Self-pay

## 2021-04-05 ENCOUNTER — Other Ambulatory Visit (INDEPENDENT_AMBULATORY_CARE_PROVIDER_SITE_OTHER): Payer: Self-pay

## 2021-04-05 DIAGNOSIS — Z1211 Encounter for screening for malignant neoplasm of colon: Secondary | ICD-10-CM

## 2021-04-05 MED ORDER — SUTAB 1479-225-188 MG PO TABS: 12.0000 | ORAL_TABLET | Freq: Once | ORAL | 0 refills | Status: AC

## 2021-04-05 NOTE — Progress Notes (Signed)
Gastroenterology Pre-Procedure Review  Request Date: 001/10/2021 Requesting Physician: Dr. Marius Ditch  PATIENT REVIEW QUESTIONS: The patient responded to the following health history questions as indicated:    1. Are you having any GI issues? no 2. Do you have a personal history of Polyps? yes (removed 8 last time) 3. Do you have a family history of Colon Cancer or Polyps yes 4. Diabetes Mellitus? no 5. Joint replacements in the past 12 months?no 6. Major health problems in the past 3 months?no 7. Any artificial heart valves, MVP, or defibrillator?no    MEDICATIONS & ALLERGIES:    Patient reports the following regarding taking any anticoagulation/antiplatelet therapy:   Plavix, Coumadin, Eliquis, Xarelto, Lovenox, Pradaxa, Brilinta, or Effient? no Aspirin? no  Patient confirms/reports the following medications:  Current Outpatient Medications  Medication Sig Dispense Refill   Cholecalciferol (VITAMIN D3) 25 MCG (1000 UT) CAPS Take 2 capsules by mouth daily.     Sodium Sulfate-Mag Sulfate-KCl (SUTAB) [phone] MG TABS Take 12 tablets by mouth once for 1 dose. 24 tablet 0   valACYclovir (VALTREX) 1000 MG tablet Take 1,000 mg by mouth daily.     No current facility-administered medications for this visit.    Patient confirms/reports the following allergies:  No Known Allergies  No orders of the defined types were placed in this encounter.   AUTHORIZATION INFORMATION Primary Insurance: 1D#: Group #:  Secondary Insurance: 1D#: Group #:  SCHEDULE INFORMATION: Date: 05/24/2021 Time: Location:[facility]

## 2021-04-05 NOTE — Telephone Encounter (Signed)
Scheduled

## 2021-04-09 ENCOUNTER — Telehealth: Payer: Self-pay | Admitting: Nurse Practitioner

## 2021-04-09 NOTE — Telephone Encounter (Signed)
Medication Refill - Medication:   valACYclovir (VALTREX) 1000 MG tablet  Has the patient contacted their pharmacy? Yes.   Pt stated the Rx expired, no active Rx at the pharmacy.    Preferred Pharmacy (with phone number or street name):   [place], [place] [facility] [address]  [place] [address]  Phone: [phone] Fax: [phone]   Has the patient been seen for an appointment in the last year OR does the patient have an upcoming appointment? Yes.    Agent: Please be advised that RX refills may take up to 3 business days. We ask that you follow-up with your pharmacy.

## 2021-04-10 NOTE — Telephone Encounter (Signed)
Last RF 03/13/21 Historical medication. Requested Prescriptions  Pending Prescriptions Disp Refills   valACYclovir (VALTREX) 1000 MG tablet 30 tablet 0    Sig: Take 1 tablet (1,000 mg total) by mouth daily.     There is no refill protocol information for this order

## 2021-04-15 MED ORDER — VALACYCLOVIR HCL 1 G PO TABS: 1000.0000 mg | ORAL_TABLET | Freq: Every day | ORAL | 4 refills | Status: DC

## 2021-04-15 NOTE — Telephone Encounter (Signed)
Pt is running low but not completley out, she says that the pharmacy told her to to contact her PCP for refills. Please advise

## 2021-04-15 NOTE — Addendum Note (Signed)
Addended by: Marnee Guarneri T on: 04/15/2021 05:54 PM   Modules accepted: Orders

## 2021-04-28 ENCOUNTER — Ambulatory Visit
Admission: RE | Admit: 2021-04-28 | Discharge: 2021-04-28 | Disposition: A | Discharge status: Home / Self Care | Payer: BC Managed Care – PPO | Source: Ambulatory Visit | Attending: Nurse Practitioner | Admitting: Nurse Practitioner

## 2021-04-28 ENCOUNTER — Other Ambulatory Visit: Payer: Self-pay

## 2021-04-28 DIAGNOSIS — Z1231 Encounter for screening mammogram for malignant neoplasm of breast: Secondary | ICD-10-CM

## 2021-04-28 NOTE — Progress Notes (Signed)
Contacted via MyChart   Good evening Demetress, your mammogram is normal -- repeat in one year:)

## 2021-05-17 ENCOUNTER — Telehealth: Payer: Self-pay

## 2021-05-17 NOTE — Telephone Encounter (Signed)
Patient left a voicemail because she has a question about her colonoscopy that is schedule for 05/24/2021. Called patient back and she states she was calling to find out if she could leave her ear cartilage piercing in because she just got it pierced  Informed patient she could not she had to take it out. She verbalized understanding

## 2021-05-24 ENCOUNTER — Encounter: Payer: Self-pay | Admitting: Gastroenterology

## 2021-05-24 ENCOUNTER — Ambulatory Visit
Admission: RE | Admit: 2021-05-24 | Discharge: 2021-05-24 | Disposition: A | Discharge status: Home / Self Care | Payer: BC Managed Care – PPO | Attending: Gastroenterology | Admitting: Gastroenterology

## 2021-05-24 ENCOUNTER — Ambulatory Visit: Payer: BC Managed Care – PPO | Admitting: Anesthesiology

## 2021-05-24 ENCOUNTER — Encounter
Admission: RE | Disposition: A | Discharge status: Home / Self Care | Payer: Self-pay | Source: Home / Self Care | Attending: Gastroenterology

## 2021-05-24 DIAGNOSIS — Z800 Family history of malignant neoplasm of digestive organs: Secondary | ICD-10-CM | POA: Insufficient documentation

## 2021-05-24 DIAGNOSIS — Z86010 Personal history of colonic polyps: Secondary | ICD-10-CM

## 2021-05-24 DIAGNOSIS — F32A Depression, unspecified: Secondary | ICD-10-CM | POA: Insufficient documentation

## 2021-05-24 DIAGNOSIS — Z1211 Encounter for screening for malignant neoplasm of colon: Secondary | ICD-10-CM | POA: Insufficient documentation

## 2021-05-24 DIAGNOSIS — Z79899 Other long term (current) drug therapy: Secondary | ICD-10-CM | POA: Insufficient documentation

## 2021-05-24 DIAGNOSIS — I10 Essential (primary) hypertension: Secondary | ICD-10-CM | POA: Insufficient documentation

## 2021-05-24 DIAGNOSIS — K644 Residual hemorrhoidal skin tags: Secondary | ICD-10-CM | POA: Diagnosis not present

## 2021-05-24 HISTORY — PX: COLONOSCOPY WITH PROPOFOL: SHX5780

## 2021-05-24 SURGERY — COLONOSCOPY WITH PROPOFOL
Anesthesia: General

## 2021-05-24 MED ORDER — STERILE WATER FOR IRRIGATION IR SOLN
Status: DC | PRN
  Administered 2021-05-24: 120 mL

## 2021-05-24 MED ORDER — PROPOFOL 10 MG/ML IV BOLUS
INTRAVENOUS | Status: DC | PRN
  Administered 2021-05-24: 50 mg via INTRAVENOUS

## 2021-05-24 MED ORDER — SODIUM CHLORIDE 0.9 % IV SOLN: INTRAVENOUS | Status: DC

## 2021-05-24 MED ORDER — PROPOFOL 500 MG/50ML IV EMUL
INTRAVENOUS | Status: DC | PRN
  Administered 2021-05-24: 200 ug/kg/min via INTRAVENOUS

## 2021-05-24 MED ORDER — SODIUM CHLORIDE 0.9 % IV SOLN: INTRAVENOUS | Status: DC | PRN

## 2021-05-24 NOTE — Transfer of Care (Signed)
Immediate Anesthesia Transfer of Care Note  Patient: Maria Ball  Procedure(s) Performed: COLONOSCOPY WITH PROPOFOL  Patient Location: PACU  Anesthesia Type:General  Level of Consciousness: awake, alert  and oriented  Airway & Oxygen Therapy: Patient Spontanous Breathing and Patient connected to nasal cannula oxygen  Post-op Assessment: Report given to RN and Post -op Vital signs reviewed and stable  Post vital signs: Reviewed and stable  Last Vitals:  Vitals Value Taken Time  BP    Temp    Pulse 86 05/24/21 0842  Resp 19 05/24/21 0842  SpO2 97 % 05/24/21 0842  Vitals shown include unvalidated device data.  Last Pain:  Vitals:   05/24/21 0718  TempSrc: Temporal  PainSc: 0-No pain         Complications: No notable events documented.

## 2021-05-24 NOTE — Op Note (Signed)
[facility] Gastroenterology Patient Name: Maria Ball Procedure Date: 05/24/2021 8:15 AM MRN: [id number] Account #: [id number] Date of Birth: May 11, 1960 Admit Type: Outpatient Age: 61 Room: [facility] ENDO ROOM 1 Gender: Female Note Status: Finalized Instrument Name: Jasper Riling [id number] Procedure:             Colonoscopy Indications:           Screening in patient at increased risk: Colorectal                         cancer in mother 52 or older, Surveillance: Personal                         history of adenomatous polyps on last colonoscopy 3                         years ago, Last colonoscopy: December 2019 Providers:             Lin Landsman MD, MD Referring MD:          Barbaraann Faster. Ned Card (Referring MD) Medicines:             General Anesthesia Complications:         No immediate complications. Estimated blood loss: None. Procedure:             Pre-Anesthesia Assessment:                        - Prior to the procedure, a History and Physical was                         performed, and patient medications and allergies were                         reviewed. The patient is competent. The risks and                         benefits of the procedure and the sedation options and                         risks were discussed with the patient. All questions                         were answered and informed consent was obtained.                         Patient identification and proposed procedure were                         verified by the physician, the nurse, the                         anesthesiologist, the anesthetist and the technician                         in the pre-procedure area in the procedure room in the                         endoscopy suite. Mental Status Examination: alert and  oriented. Airway Examination: normal oropharyngeal                         airway and neck mobility. Respiratory Examination:                          clear to auscultation. CV Examination: normal.                         Prophylactic Antibiotics: The patient does not require                         prophylactic antibiotics. Prior Anticoagulants: The                         patient has taken no previous anticoagulant or                         antiplatelet agents. ASA Grade Assessment: II - A                         patient with mild systemic disease. After reviewing                         the risks and benefits, the patient was deemed in                         satisfactory condition to undergo the procedure. The                         anesthesia plan was to use general anesthesia.                         Immediately prior to administration of medications,                         the patient was re-assessed for adequacy to receive                         sedatives. The heart rate, respiratory rate, oxygen                         saturations, blood pressure, adequacy of pulmonary                         ventilation, and response to care were monitored                         throughout the procedure. The physical status of the                         patient was re-assessed after the procedure.                        After obtaining informed consent, the colonoscope was                         passed under direct vision. Throughout the procedure,  the patient's blood pressure, pulse, and oxygen                         saturations were monitored continuously. The                         Colonoscope was introduced through the anus and                         advanced to the the terminal ileum, with                         identification of the appendiceal orifice and IC                         valve. The colonoscopy was performed without                         difficulty. The patient tolerated the procedure well.                         The quality of the bowel preparation was evaluated                          using the BBPS [facility] Bowel Preparation Scale) with                         scores of: Right Colon = 3, Transverse Colon = 3 and                         Left Colon = 3 (entire mucosa seen well with no                         residual staining, small fragments of stool or opaque                         liquid). The total BBPS score equals 9. Findings:      The perianal and digital rectal examinations were normal. Pertinent       negatives include normal sphincter tone and no palpable rectal lesions.      The terminal ileum appeared normal.      The entire examined colon appeared normal.      The retroflexed view of the distal rectum and anal verge was normal and       showed no anal or rectal abnormalities.      Skin tags were found on perianal exam. Impression:            - The examined portion of the ileum was normal.                        - The entire examined colon is normal.                        - The distal rectum and anal verge are normal on                         retroflexion view.                        -  Perianal skin tags found on perianal exam.                        - No specimens collected. Recommendation:        - Discharge patient to home (with escort).                        - Resume previous diet today.                        - Continue present medications.                        - Repeat colonoscopy in 5 years for surveillance. Procedure Code(s):     --- Professional ---                        D3267, Colorectal cancer screening; colonoscopy on                         individual at high risk Diagnosis Code(s):     --- Professional ---                        Z80.0, Family history of malignant neoplasm of                         digestive organs                        Z86.010, Personal history of colonic polyps                        K64.4, Residual hemorrhoidal skin tags CPT copyright 2019 American [employer] Association. All rights reserved. The codes documented in  this report are preliminary and upon coder review may  be revised to meet current compliance requirements. Dr. Ulyess Mort Lin Landsman MD, MD 05/24/2021 8:40:17 AM This report has been signed electronically. Number of Addenda: 0 Note Initiated On: 05/24/2021 8:15 AM Scope Withdrawal Time: 0 hours 9 minutes 12 seconds  Total Procedure Duration: 0 hours 12 minutes 46 seconds  Estimated Blood Loss:  Estimated blood loss: none. Estimated blood loss: none.      [facility]

## 2021-05-24 NOTE — H&P (Signed)
Cephas Darby, MD [address]  [place]  [place], [place] [address]  Main: [phone]  Fax: [phone] Pager: [phone]  Primary Care Physician:  Venita Lick, NP Primary Gastroenterologist:  Dr. Cephas Darby  Pre-Procedure History & Physical: HPI:  Maria Ball is a 61 y.o. female is here for an colonoscopy.   Past Medical History:  Diagnosis Date   Alkaline phosphatase elevation    Basal cell carcinoma 02/17/2021   L of midline sup forehead - ED&C   Depression    Depression 12/11/2014   DVT (deep venous thrombosis) (HCC)    while taking OCP in the 1980's   Hypertension    Shingles    Shingles     Past Surgical History:  Procedure Laterality Date   COLONOSCOPY WITH PROPOFOL N/A 04/16/2018   Procedure: COLONOSCOPY WITH PROPOFOL;  Surgeon: Jonathon Bellows, MD;  Location: [facility] ENDOSCOPY;  Service: Gastroenterology;  Laterality: N/A;   DILATION AND CURETTAGE OF UTERUS     TONSILLECTOMY      Prior to Admission medications   Medication Sig Start Date End Date Taking? Authorizing Provider  Cholecalciferol (VITAMIN D3) 25 MCG (1000 UT) CAPS Take 2 capsules by mouth daily.    [provider]  valACYclovir (VALTREX) 1000 MG tablet Take 1 tablet (1,000 mg total) by mouth daily. 04/15/21   Marnee Guarneri T, NP    Allergies as of 04/05/2021   (No Known Allergies)    Family History  Problem Relation Age of Onset   Cancer Mother        colon-rectal   Emphysema Father    Cancer Sister        breast   Hypertension Sister    Diabetes Sister    Breast cancer Sister 20   Stroke Sister    Hypertension Brother     Social History   Socioeconomic History   Marital status: Divorced    Spouse name: Not on file   Number of children: Not on file   Years of education: Not on file   Highest education level: Not on file  Occupational History   Not on file  Tobacco Use   Smoking status: Never   Smokeless tobacco: Never  Vaping Use   Vaping  Use: Never used  Substance and Sexual Activity   Alcohol use: No    Alcohol/week: 0.0 standard drinks   Drug use: No   Sexual activity: Never  Other Topics Concern   Not on file  Social History Narrative   Not on file   Social Determinants of Health   Financial Resource Strain: Not on file  Food Insecurity: Not on file  Transportation Needs: Not on file  Physical Activity: Not on file  Stress: Not on file  Social Connections: Not on file  Intimate Partner Violence: Not on file    Review of Systems: See HPI, otherwise negative ROS  Physical Exam: BP 119/84    Pulse 83    Temp (!) 96.3 F (35.7 C) (Temporal)    Resp 18    Ht 5\' 6"  (1.676 m)    Wt 86.2 kg    LMP  (LMP Unknown)    SpO2 98%    BMI 30.67 kg/m  General:   Alert,  pleasant and cooperative in NAD Head:  Normocephalic and atraumatic. Neck:  Supple; no masses or thyromegaly. Lungs:  Clear throughout to auscultation.    Heart:  Regular rate and rhythm. Abdomen:  Soft, nontender and nondistended. Normal  bowel sounds, without guarding, and without rebound.   Neurologic:  Alert and  oriented x4;  grossly normal neurologically.  Impression/Plan: Maria Ball is here for an colonoscopy to be performed for h/o colon adenomas  Risks, benefits, limitations, and alternatives regarding  colonoscopy have been reviewed with the patient.  Questions have been answered.  All parties agreeable.   Sherri Sear, MD  05/24/2021, 7:41 AM

## 2021-05-24 NOTE — Anesthesia Preprocedure Evaluation (Signed)
Anesthesia Evaluation  Patient identified by MRN, date of birth, ID band Patient awake    Reviewed: Allergy & Precautions, H&P , NPO status , Patient's Chart, lab work & pertinent test results  History of Anesthesia Complications Negative for: history of anesthetic complications  Airway Mallampati: III  TM Distance: <3 FB Neck ROM: full    Dental  (+) Chipped, Dental Advidsory Given   Pulmonary neg pulmonary ROS, neg shortness of breath,           Cardiovascular Exercise Tolerance: Good hypertension (well controlled, no meds), (-) angina(-) Past MI and (-) DOE (-) dysrhythmias (-) Valvular Problems/Murmurs     Neuro/Psych PSYCHIATRIC DISORDERS Depression negative neurological ROS     GI/Hepatic negative GI ROS, Neg liver ROS, neg GERD  ,  Endo/Other  negative endocrine ROS  Renal/GU negative Renal ROS  negative genitourinary   Musculoskeletal   Abdominal   Peds  Hematology negative hematology ROS (+)   Anesthesia Other Findings Past Medical History: No date: Alkaline phosphatase elevation No date: Depression 12/11/2014: Depression No date: Hypertension  Past Surgical History: No date: TONSILLECTOMY  BMI    Body Mass Index:  32.28 kg/m      Reproductive/Obstetrics negative OB ROS                             Anesthesia Physical  Anesthesia Plan  ASA: 2  Anesthesia Plan: General   Post-op Pain Management:    Induction: Intravenous  PONV Risk Score and Plan: Propofol infusion and TIVA  Airway Management Planned: Natural Airway and Nasal Cannula  Additional Equipment:   Intra-op Plan:   Post-operative Plan:   Informed Consent: I have reviewed the patients History and Physical, chart, labs and discussed the procedure including the risks, benefits and alternatives for the proposed anesthesia with the patient or authorized representative who has indicated his/her  understanding and acceptance.     Dental Advisory Given  Plan Discussed with: Anesthesiologist, CRNA and Surgeon  Anesthesia Plan Comments: (Patient consented for risks of anesthesia including but not limited to:  - adverse reactions to medications - risk of intubation if required - damage to teeth, lips or other oral mucosa - sore throat or hoarseness - Damage to heart, brain, lungs or loss of life  Patient voiced understanding.)        Anesthesia Quick Evaluation

## 2021-05-25 NOTE — Anesthesia Postprocedure Evaluation (Signed)
Anesthesia Post Note  Patient: Maria Ball  Procedure(s) Performed: COLONOSCOPY WITH PROPOFOL  Patient location during evaluation: Endoscopy Anesthesia Type: General Level of consciousness: awake and alert Pain management: pain level controlled Vital Signs Assessment: post-procedure vital signs reviewed and stable Respiratory status: spontaneous breathing, nonlabored ventilation, respiratory function stable and patient connected to nasal cannula oxygen Cardiovascular status: blood pressure returned to baseline and stable Postop Assessment: no apparent nausea or vomiting Anesthetic complications: no   No notable events documented.   Last Vitals:  Vitals:   05/24/21 0852 05/24/21 0911  BP: 120/66 134/67  Pulse:    Resp:    Temp:    SpO2:      Last Pain:  Vitals:   05/25/21 0726  TempSrc:   PainSc: 0-No pain                 Martha Clan

## 2021-06-24 ENCOUNTER — Other Ambulatory Visit: Payer: Self-pay

## 2021-06-24 ENCOUNTER — Ambulatory Visit: Payer: BC Managed Care – PPO | Admitting: Dermatology

## 2021-06-24 DIAGNOSIS — Z1283 Encounter for screening for malignant neoplasm of skin: Secondary | ICD-10-CM

## 2021-06-24 DIAGNOSIS — D1800 Hemangioma unspecified site: Secondary | ICD-10-CM

## 2021-06-24 DIAGNOSIS — L578 Other skin changes due to chronic exposure to nonionizing radiation: Secondary | ICD-10-CM

## 2021-06-24 DIAGNOSIS — L821 Other seborrheic keratosis: Secondary | ICD-10-CM

## 2021-06-24 DIAGNOSIS — D229 Melanocytic nevi, unspecified: Secondary | ICD-10-CM

## 2021-06-24 DIAGNOSIS — L814 Other melanin hyperpigmentation: Secondary | ICD-10-CM

## 2021-06-24 DIAGNOSIS — Z85828 Personal history of other malignant neoplasm of skin: Secondary | ICD-10-CM

## 2021-06-24 NOTE — Progress Notes (Signed)
° °  Follow-Up Visit   Subjective  Maria Ball is a 61 y.o. female who presents for the following: hx of BCC (L of midline sup forehead, EDC 02/17/21) and Total Body skin exam (No concerns). The patient presents for Total-Body Skin Exam (TBSE) for skin cancer screening and mole check.  The patient has spots, moles and lesions to be evaluated, some may be new or changing and the patient has concerns that these could be cancer.  The following portions of the chart were reviewed this encounter and updated as appropriate:   Tobacco   Allergies   Meds   Problems   Med Hx   Surg Hx   Fam Hx      Review of Systems:  No other skin or systemic complaints except as noted in HPI or Assessment and Plan.  Objective  Well appearing patient in no apparent distress; mood and affect are within normal limits.  A full examination was performed including scalp, head, eyes, ears, nose, lips, neck, chest, axillae, abdomen, back, buttocks, bilateral upper extremities, bilateral lower extremities, hands, feet, fingers, toes, fingernails, and toenails. All findings within normal limits unless otherwise noted below.  L of midline sup forehead Well healed scar with no evidence of recurrence.    Assessment & Plan  History of basal cell carcinoma (BCC) L of midline sup forehead  Clear. Observe for recurrence. Call clinic for new or changing lesions.  Recommend regular skin exams, daily broad-spectrum spf 30+ sunscreen use, and photoprotection.    Skin cancer screening  Lentigines - Scattered tan macules - Due to sun exposure - Benign-appearing, observe - Recommend daily broad spectrum sunscreen SPF 30+ to sun-exposed areas, reapply every 2 hours as needed. - Call for any changes  Seborrheic Keratoses - Stuck-on, waxy, tan-brown papules and/or plaques  - Benign-appearing - Discussed benign etiology and prognosis. - Observe - Call for any changes  Melanocytic Nevi - Tan-brown and/or  pink-flesh-colored symmetric macules and papules - Benign appearing on exam today - Observation - Call clinic for new or changing moles - Recommend daily use of broad spectrum spf 30+ sunscreen to sun-exposed areas.   Hemangiomas - Red papules - Discussed benign nature - Observe - Call for any changes  Actinic Damage - Chronic condition, secondary to cumulative UV/sun exposure - diffuse scaly erythematous macules with underlying dyspigmentation - Recommend daily broad spectrum sunscreen SPF 30+ to sun-exposed areas, reapply every 2 hours as needed.  - Staying in the shade or wearing long sleeves, sun glasses (UVA+UVB protection) and wide brim hats (4-inch brim around the entire circumference of the hat) are also recommended for sun protection.  - Call for new or changing lesions.  Skin cancer screening performed today.  Return in about 1 year (around 06/24/2022) for TBSE, Hx of BCC.  I, Othelia Pulling, RMA, am acting as scribe for Sarina Ser, MD . Documentation: I have reviewed the above documentation for accuracy and completeness, and I agree with the above.  Sarina Ser, MD

## 2021-06-24 NOTE — Patient Instructions (Signed)

## 2021-07-03 ENCOUNTER — Encounter: Payer: Self-pay | Admitting: Dermatology

## 2022-03-27 NOTE — Patient Instructions (Signed)

## 2022-03-30 ENCOUNTER — Encounter: Payer: Self-pay | Admitting: Nurse Practitioner

## 2022-03-30 ENCOUNTER — Ambulatory Visit (INDEPENDENT_AMBULATORY_CARE_PROVIDER_SITE_OTHER): Payer: BC Managed Care – PPO | Admitting: Nurse Practitioner

## 2022-03-30 VITALS — BP 114/72 | HR 79 | Temp 98.2°F | Ht 65.98 in | Wt 189.0 lb

## 2022-03-30 DIAGNOSIS — E559 Vitamin D deficiency, unspecified: Secondary | ICD-10-CM | POA: Diagnosis not present

## 2022-03-30 DIAGNOSIS — Z6830 Body mass index (BMI) 30.0-30.9, adult: Secondary | ICD-10-CM

## 2022-03-30 DIAGNOSIS — E6609 Other obesity due to excess calories: Secondary | ICD-10-CM

## 2022-03-30 DIAGNOSIS — Z23 Encounter for immunization: Secondary | ICD-10-CM

## 2022-03-30 DIAGNOSIS — I10 Essential (primary) hypertension: Secondary | ICD-10-CM | POA: Diagnosis not present

## 2022-03-30 DIAGNOSIS — E7800 Pure hypercholesterolemia, unspecified: Secondary | ICD-10-CM

## 2022-03-30 DIAGNOSIS — R748 Abnormal levels of other serum enzymes: Secondary | ICD-10-CM

## 2022-03-30 DIAGNOSIS — Z86010 Personal history of colonic polyps: Secondary | ICD-10-CM | POA: Diagnosis not present

## 2022-03-30 DIAGNOSIS — Z1231 Encounter for screening mammogram for malignant neoplasm of breast: Secondary | ICD-10-CM

## 2022-03-30 DIAGNOSIS — Z0000 Encounter for general adult medical examination without abnormal findings: Secondary | ICD-10-CM

## 2022-03-30 MED ORDER — VALACYCLOVIR HCL 1 G PO TABS: 1000.0000 mg | ORAL_TABLET | Freq: Every day | ORAL | 4 refills | Status: DC

## 2022-03-30 NOTE — Assessment & Plan Note (Signed)
Chronic, noted on past labs with ASCVD 3.7% and LDL <190 -- considered lower risk at this time.  Continue without medication and focus on diet and exercise.  Lipid panel today.

## 2022-03-30 NOTE — Progress Notes (Signed)
BP 114/72   Pulse 79   Temp 98.2 F (36.8 C) (Oral)   Ht 5' 5.98" (1.676 m)   Wt 189 lb (85.7 kg)   LMP  (LMP Unknown)   SpO2 97%   BMI 30.52 kg/m    Subjective:    Patient ID: Maria Ball, female    DOB: 30-Jan-1961, 61 y.o.   MRN: [id number]  HPI: Maria Ball is a 61 y.o. female presenting on 03/30/2022 for comprehensive medical examination. Current medical complaints include:none  She currently lives with: son and daughter Menopausal Symptoms: no  The 10-year ASCVD risk score (Arnett DK, et al., 2019) is: 3.7%   Values used to calculate the score:     Age: 61 years     Sex: Female     Is Non-Hispanic African American: No     Diabetic: No     Tobacco smoker: No     Systolic Blood Pressure: 979 mmHg     Is BP treated: No     HDL Cholesterol: 40 mg/dL     Total Cholesterol: 185 mg/dL  Depression Screen done today and results listed below:     03/30/2022    8:30 AM 03/29/2021    2:29 PM 03/27/2020    8:01 AM 03/26/2019    8:29 AM 03/23/2018    1:11 PM  Depression screen PHQ 2/9  Decreased Interest 0 0 0 0 0  Down, Depressed, Hopeless 0 0 0 0 0  PHQ - 2 Score 0 0 0 0 0  Altered sleeping 0 0  0 0  Tired, decreased energy 0 0  0 0  Change in appetite 0 0  0 0  Feeling bad or failure about yourself  0 0  0 0  Trouble concentrating 0 0  0 0  Moving slowly or fidgety/restless 0 0  0 0  Suicidal thoughts 0 0  0 0  PHQ-9 Score 0 0  0 0  Difficult doing work/chores Not difficult at all Not difficult at all   Not difficult at all      02/21/2019    4:06 PM 03/26/2019    8:28 AM 03/29/2021    2:29 PM 03/30/2022    8:30 AM 03/30/2022    8:31 AM  [place] in the past year? 0 0 0 1 1  Was there an injury with Fall?  0 0 1 0  Fall Risk Category Calculator  0 0 2 1  Fall Risk Category  Low Low Moderate Low  Patient Fall Risk Level  Low fall risk Low fall risk  Low fall risk  Patient at Risk for Falls Due to   No Fall Risks History of  fall(s) History of fall(s)  Fall risk Follow up   Falls evaluation completed Falls evaluation completed Falls prevention discussed    Functional Status Survey: Is the patient deaf or have difficulty hearing?: No Does the patient have difficulty seeing, even when wearing glasses/contacts?: No Does the patient have difficulty concentrating, remembering, or making decisions?: No Does the patient have difficulty walking or climbing stairs?: No Does the patient have difficulty dressing or bathing?: No Does the patient have difficulty doing errands alone such as visiting a doctor's office or shopping?: No   Past Medical History:  Past Medical History:  Diagnosis Date   Alkaline phosphatase elevation    Basal cell carcinoma 02/17/2021   L of midline sup forehead - ED&C   Depression    Depression  12/11/2014   DVT (deep venous thrombosis) (HCC)    while taking OCP in the 1980's   Hypertension    Ball    Ball     Surgical History:  Past Surgical History:  Procedure Laterality Date   COLONOSCOPY WITH PROPOFOL N/A 04/16/2018   Procedure: COLONOSCOPY WITH PROPOFOL;  Surgeon: Jonathon Bellows, MD;  Location: [facility] ENDOSCOPY;  Service: Gastroenterology;  Laterality: N/A;   COLONOSCOPY WITH PROPOFOL N/A 05/24/2021   Procedure: COLONOSCOPY WITH PROPOFOL;  Surgeon: Lin Landsman, MD;  Location: [facility] ENDOSCOPY;  Service: Gastroenterology;  Laterality: N/A;   DILATION AND CURETTAGE OF UTERUS     TONSILLECTOMY      Medications:  Current Outpatient Medications on File Prior to Visit  Medication Sig   Cholecalciferol (VITAMIN D3) 25 MCG (1000 UT) CAPS Take 2 capsules by mouth daily.   No current facility-administered medications on file prior to visit.    Allergies:  No Known Allergies  Social History:  Social History   Socioeconomic History   Marital status: Divorced    Spouse name: Not on file   Number of children: Not on file   Years of education: Not on file   Highest  education level: Not on file  Occupational History   Not on file  Tobacco Use   Smoking status: Never   Smokeless tobacco: Never  Vaping Use   Vaping Use: Never used  Substance and Sexual Activity   Alcohol use: No    Alcohol/week: 0.0 standard drinks of alcohol   Drug use: No   Sexual activity: Never  Other Topics Concern   Not on file  Social History Narrative   Not on file   Social Determinants of Health   Financial Resource Strain: Not on file  Food Insecurity: Not on file  Transportation Needs: Not on file  Physical Activity: Not on file  Stress: Not on file  Social Connections: Not on file  Intimate Partner Violence: Not on file   Social History   Tobacco Use  Smoking Status Never  Smokeless Tobacco Never   Social History   Substance and Sexual Activity  Alcohol Use No   Alcohol/week: 0.0 standard drinks of alcohol    Family History:  Family History  Problem Relation Age of Onset   Cancer Mother        colon-rectal   Emphysema Father    Cancer Sister        breast   Hypertension Sister    Diabetes Sister    Breast cancer Sister 26   Stroke Sister    Hypertension Brother     Past medical history, surgical history, medications, allergies, family history and social history reviewed with patient today and changes made to appropriate areas of the chart.   Review of Systems - negative All other ROS negative except what is listed above and in the HPI.      Objective:    BP 114/72   Pulse 79   Temp 98.2 F (36.8 C) (Oral)   Ht 5' 5.98" (1.676 m)   Wt 189 lb (85.7 kg)   LMP  (LMP Unknown)   SpO2 97%   BMI 30.52 kg/m   Wt Readings from Last 3 Encounters:  03/30/22 189 lb (85.7 kg)  05/24/21 190 lb (86.2 kg)  03/29/21 194 lb 6.4 oz (88.2 kg)    Physical Exam Vitals and nursing note reviewed. Exam conducted with a chaperone present.  Constitutional:      General: She is  awake. She is not in acute distress.    Appearance: She is  well-developed. She is not ill-appearing.  HENT:     Head: Normocephalic and atraumatic.     Right Ear: Hearing, tympanic membrane, ear canal and external ear normal. No drainage.     Left Ear: Hearing, tympanic membrane, ear canal and external ear normal. No drainage.     Nose: Nose normal.     Right Sinus: No maxillary sinus tenderness or frontal sinus tenderness.     Left Sinus: No maxillary sinus tenderness or frontal sinus tenderness.     Mouth/Throat:     Mouth: Mucous membranes are moist.     Pharynx: Oropharynx is clear. Uvula midline. No pharyngeal swelling, oropharyngeal exudate or posterior oropharyngeal erythema.  Eyes:     General: Lids are normal.        Right eye: No discharge.        Left eye: No discharge.     Extraocular Movements: Extraocular movements intact.     Conjunctiva/sclera: Conjunctivae normal.     Pupils: Pupils are equal, round, and reactive to light.     Visual Fields: Right eye visual fields normal and left eye visual fields normal.  Neck:     Thyroid: No thyromegaly.     Vascular: No carotid bruit.     Trachea: Trachea normal.  Cardiovascular:     Rate and Rhythm: Normal rate and regular rhythm.     Heart sounds: Normal heart sounds. No murmur heard.    No gallop.  Pulmonary:     Effort: Pulmonary effort is normal. No accessory muscle usage or respiratory distress.     Breath sounds: Normal breath sounds.  Chest:  Breasts:    Right: Normal.     Left: Normal.  Abdominal:     General: Bowel sounds are normal.     Palpations: Abdomen is soft. There is no hepatomegaly or splenomegaly.     Tenderness: There is no abdominal tenderness.  Musculoskeletal:        General: Normal range of motion.     Cervical back: Normal range of motion and neck supple.     Right lower leg: No edema.     Left lower leg: No edema.  Lymphadenopathy:     Head:     Right side of head: No submental, submandibular, tonsillar, preauricular or posterior auricular  adenopathy.     Left side of head: No submental, submandibular, tonsillar, preauricular or posterior auricular adenopathy.     Cervical: No cervical adenopathy.     Upper Body:     Right upper body: No supraclavicular, axillary or pectoral adenopathy.     Left upper body: No supraclavicular, axillary or pectoral adenopathy.  Skin:    General: Skin is warm and dry.     Capillary Refill: Capillary refill takes less than 2 seconds.     Findings: No rash.  Neurological:     Mental Status: She is alert and oriented to person, place, and time.     Gait: Gait is intact.     Deep Tendon Reflexes: Reflexes are normal and symmetric.     Reflex Scores:      Brachioradialis reflexes are 2+ on the right side and 2+ on the left side.      Patellar reflexes are 2+ on the right side and 2+ on the left side. Psychiatric:        Attention and Perception: Attention normal.  Mood and Affect: Mood normal.        Speech: Speech normal.        Behavior: Behavior normal. Behavior is cooperative.        Thought Content: Thought content normal.        Judgment: Judgment normal.    Results for orders placed or performed in visit on 03/29/21  CBC with Differential/Platelet  Result Value Ref Range   WBC 7.2 3.4 - 10.8 x10E3/uL   RBC 4.96 3.77 - 5.28 x10E6/uL   Hemoglobin 13.4 11.1 - 15.9 g/dL   Hematocrit 41.6 34.0 - 46.6 %   MCV 84 79 - 97 fL   MCH 27.0 26.6 - 33.0 pg   MCHC 32.2 31.5 - 35.7 g/dL   RDW 13.2 11.7 - 15.4 %   Platelets 366 150 - 450 x10E3/uL   Neutrophils 56 Not Estab. %   Lymphs 36 Not Estab. %   Monocytes 6 Not Estab. %   Eos 1 Not Estab. %   Basos 1 Not Estab. %   Neutrophils Absolute 4.0 1.4 - 7.0 x10E3/uL   Lymphocytes Absolute 2.6 0.7 - 3.1 x10E3/uL   Monocytes Absolute 0.5 0.1 - 0.9 x10E3/uL   EOS (ABSOLUTE) 0.1 0.0 - 0.4 x10E3/uL   Basophils Absolute 0.0 0.0 - 0.2 x10E3/uL   Immature Granulocytes 0 Not Estab. %   Immature Grans (Abs) 0.0 0.0 - 0.1 x10E3/uL   Comprehensive metabolic panel  Result Value Ref Range   Glucose 89 70 - 99 mg/dL   BUN 9 8 - 27 mg/dL   Creatinine, Ser 0.65 0.57 - 1.00 mg/dL   eGFR 100 >59 mL/min/1.73   BUN/Creatinine Ratio 14 12 - 28   Sodium 138 134 - 144 mmol/L   Potassium 4.6 3.5 - 5.2 mmol/L   Chloride 100 96 - 106 mmol/L   CO2 25 20 - 29 mmol/L   Calcium 9.3 8.7 - 10.3 mg/dL   Total Protein 6.9 6.0 - 8.5 g/dL   Albumin 4.6 3.8 - 4.8 g/dL   Globulin, Total 2.3 1.5 - 4.5 g/dL   Albumin/Globulin Ratio 2.0 1.2 - 2.2   Bilirubin Total 0.2 0.0 - 1.2 mg/dL   Alkaline Phosphatase 165 (H) 44 - 121 IU/L   AST 18 0 - 40 IU/L   ALT 22 0 - 32 IU/L  Lipid Panel w/o Chol/HDL Ratio  Result Value Ref Range   Cholesterol, Total 185 100 - 199 mg/dL   Triglycerides 224 (H) 0 - 149 mg/dL   HDL 40 >39 mg/dL   VLDL Cholesterol Cal 39 5 - 40 mg/dL   LDL Chol Calc (NIH) 106 (H) 0 - 99 mg/dL  TSH  Result Value Ref Range   TSH 1.220 0.450 - 4.500 uIU/mL  VITAMIN D 25 Hydroxy (Vit-D Deficiency, Fractures)  Result Value Ref Range   Vit D, 25-Hydroxy 28.3 (L) 30.0 - 100.0 ng/mL  Gamma GT  Result Value Ref Range   GGT 18 0 - 60 IU/L      Assessment & Plan:   Problem List Items Addressed This Visit       Cardiovascular and Mediastinum   Hypertension - Primary    Ongoing, stable. Has made diet changes over past 3 years with benefit.  BP below goal.  No medications at this time.  Continue to monitor and initiate as needed.  Labs today.      Relevant Orders   CBC with Differential/Platelet   Comprehensive metabolic panel   TSH  Other   Elevated alkaline phosphatase level    Ongoing, normal GGT and no RUQ symptoms.  Will continue to monitor.  Educated patient on findings.  Labs today.      Elevated low density lipoprotein (LDL) cholesterol level    Chronic, noted on past labs with ASCVD 3.7% and LDL <190 -- considered lower risk at this time.  Continue without medication and focus on diet and exercise.  Lipid  panel today.      Relevant Orders   Comprehensive metabolic panel   Lipid Panel w/o Chol/HDL Ratio   H/O adenomatous polyp of colon    Colonoscopy up to date, to return in 78 years.      Relevant Orders   Comprehensive metabolic panel   Gamma GT   Obesity    BMI 30.52.  Recommended eating smaller high protein, low fat meals more frequently and exercising 30 mins a day 5 times a week with a goal of 10-15lb weight loss in the next 3 months. Patient voiced their understanding and motivation to adhere to these recommendations.       Vitamin D deficiency    Chronic, stable, continue supplement daily and adjust as needed.  Check level today.      Relevant Orders   VITAMIN D 25 Hydroxy (Vit-D Deficiency, Fractures)   Other Visit Diagnoses     Encounter for screening mammogram for malignant neoplasm of breast       Relevant Orders   MM 3D SCREEN BREAST BILATERAL   Flu vaccine need       Flu vaccine today.   Encounter for annual physical exam       Annual physical today with labs and health maintenance reviewed, discussed with patient.        Follow up plan: Return in about 1 year (around 03/31/2023) for Annual physical.   LABORATORY TESTING:  - Pap smear: up to date  IMMUNIZATIONS:   - Tdap: Tetanus vaccination status reviewed: last tetanus booster within 10 years. - Influenza: Up to date  - Pneumovax: Not applicable - Prevnar: Not applicable - HPV: Not applicable - Zostavax vaccine: Up to date   SCREENING: -Mammogram: Ordered today  - Colonoscopy:Up To Date -- now on 5 year plan -- family history of colon cancer - Bone Density: Not applicable  -Hearing Test: Not applicable  -Spirometry: Not applicable   PATIENT COUNSELING:   Advised to take 1 mg of folate supplement per day if capable of pregnancy.   Sexuality: Discussed sexually transmitted diseases, partner selection, use of condoms, avoidance of unintended pregnancy  and contraceptive alternatives.    Advised to avoid cigarette smoking.  I discussed with the patient that most people either abstain from alcohol or drink within safe limits (<=14/week and <=4 drinks/occasion for males, <=7/weeks and <= 3 drinks/occasion for females) and that the risk for alcohol disorders and other health effects rises proportionally with the number of drinks per week and how often a drinker exceeds daily limits.  Discussed cessation/primary prevention of drug use and availability of treatment for abuse.   Diet: Encouraged to adjust caloric intake to maintain  or achieve ideal body weight, to reduce intake of dietary saturated fat and total fat, to limit sodium intake by avoiding high sodium foods and not adding table salt, and to maintain adequate dietary potassium and calcium preferably from fresh fruits, vegetables, and low-fat dairy products.    Stressed the importance of regular exercise  Injury prevention: Discussed safety belts, safety helmets, smoke  detector, smoking near bedding or upholstery.   Dental health: Discussed importance of regular tooth brushing, flossing, and dental visits.    NEXT PREVENTATIVE PHYSICAL DUE IN 1 YEAR. Return in about 1 year (around 03/31/2023) for Annual physical.

## 2022-03-30 NOTE — Assessment & Plan Note (Signed)
BMI 30.52.  Recommended eating smaller high protein, low fat meals more frequently and exercising 30 mins a day 5 times a week with a goal of 10-15lb weight loss in the next 3 months. Patient voiced their understanding and motivation to adhere to these recommendations.

## 2022-03-30 NOTE — Assessment & Plan Note (Signed)
Chronic, stable, continue supplement daily and adjust as needed.  Check level today.

## 2022-03-30 NOTE — Assessment & Plan Note (Signed)
Colonoscopy up to date, to return in 39 years.

## 2022-03-30 NOTE — Assessment & Plan Note (Signed)
Ongoing, stable. Has made diet changes over past 3 years with benefit.  BP below goal.  No medications at this time.  Continue to monitor and initiate as needed.  Labs today.

## 2022-03-30 NOTE — Assessment & Plan Note (Signed)
Ongoing, normal GGT and no RUQ symptoms.  Will continue to monitor.  Educated patient on findings.  Labs today.

## 2022-03-31 LAB — CBC WITH DIFFERENTIAL/PLATELET
Basophils Absolute: 0.1 10*3/uL (ref 0.0–0.2)
Basos: 1 %
EOS (ABSOLUTE): 0.1 10*3/uL (ref 0.0–0.4)
Eos: 1 %
Hematocrit: 42.9 % (ref 34.0–46.6)
Hemoglobin: 13.2 g/dL (ref 11.1–15.9)
Immature Grans (Abs): 0 10*3/uL (ref 0.0–0.1)
Immature Granulocytes: 0 %
Lymphocytes Absolute: 2.3 10*3/uL (ref 0.7–3.1)
Lymphs: 36 %
MCH: 26.1 pg — ABNORMAL LOW (ref 26.6–33.0)
MCHC: 30.8 g/dL — ABNORMAL LOW (ref 31.5–35.7)
MCV: 85 fL (ref 79–97)
Monocytes Absolute: 0.4 10*3/uL (ref 0.1–0.9)
Monocytes: 6 %
Neutrophils Absolute: 3.5 10*3/uL (ref 1.4–7.0)
Neutrophils: 56 %
Platelets: 405 10*3/uL (ref 150–450)
RBC: 5.05 x10E6/uL (ref 3.77–5.28)
RDW: 13.2 % (ref 11.7–15.4)
WBC: 6.3 10*3/uL (ref 3.4–10.8)

## 2022-03-31 LAB — COMPREHENSIVE METABOLIC PANEL
ALT: 23 IU/L (ref 0–32)
AST: 18 IU/L (ref 0–40)
Albumin/Globulin Ratio: 1.8 (ref 1.2–2.2)
Albumin: 4.3 g/dL (ref 3.9–4.9)
Alkaline Phosphatase: 204 IU/L — ABNORMAL HIGH (ref 44–121)
BUN/Creatinine Ratio: 15 (ref 12–28)
BUN: 10 mg/dL (ref 8–27)
Bilirubin Total: <0.2 mg/dL (ref 0.0–1.2)
CO2: 24 mmol/L (ref 20–29)
Calcium: 9.3 mg/dL (ref 8.7–10.3)
Chloride: 102 mmol/L (ref 96–106)
Creatinine, Ser: 0.67 mg/dL (ref 0.57–1.00)
Globulin, Total: 2.4 g/dL (ref 1.5–4.5)
Glucose: 90 mg/dL (ref 70–99)
Potassium: 4.4 mmol/L (ref 3.5–5.2)
Sodium: 141 mmol/L (ref 134–144)
Total Protein: 6.7 g/dL (ref 6.0–8.5)
eGFR: 99 mL/min/{1.73_m2} (ref >59)

## 2022-03-31 LAB — LIPID PANEL W/O CHOL/HDL RATIO
Cholesterol, Total: 201 mg/dL — ABNORMAL HIGH (ref 100–199)
HDL: 40 mg/dL (ref >39)
LDL Chol Calc (NIH): 133 mg/dL — ABNORMAL HIGH (ref 0–99)
Triglycerides: 154 mg/dL — ABNORMAL HIGH (ref 0–149)
VLDL Cholesterol Cal: 28 mg/dL (ref 5–40)

## 2022-03-31 LAB — GAMMA GT: GGT: 17 IU/L (ref 0–60)

## 2022-03-31 LAB — VITAMIN D 25 HYDROXY (VIT D DEFICIENCY, FRACTURES): Vit D, 25-Hydroxy: 21.8 ng/mL — ABNORMAL LOW (ref 30.0–100.0)

## 2022-03-31 LAB — TSH: TSH: 1.24 u[IU]/mL (ref 0.450–4.500)

## 2022-04-02 NOTE — Progress Notes (Signed)
Contacted via [place] The 10-year ASCVD risk score (Arnett DK, et al., 2019) is: 3.9%   Values used to calculate the score:     Age: 61 years     Sex: Female     Is Non-Hispanic African American: No     Diabetic: No     Tobacco smoker: No     Systolic Blood Pressure: 015 mmHg     Is BP treated: No     HDL Cholesterol: 40 mg/dL     Total Cholesterol: 201 mg/dL  Good morning Maria Ball, your labs have returned: - Kidney function, creatinine and eGFR, remains normal, as is liver function, AST and ALT. Alkaline phosphatase remains a bit elevated, but GGT and liver function stable so we will continue to monitor and consider imaging in future dependent on labs. - CBC shows stable levels. - Vitamin D remains a little low, please ensure you are taking Vitamin D3 2000 units daily. - TSH, thyroid, is normal. - Your cholesterol is still high, but continued recommendations to make lifestyle changes. Your LDL is above normal. The LDL is the bad cholesterol. Over time and in combination with inflammation and other factors, this contributes to plaque which in turn may lead to stroke and/or heart attack down the road. Sometimes high LDL is primarily genetic, and people might be eating all the right foods but still have high numbers. Other times, there is room for improvement in one's diet and eating healthier can bring this number down and potentially reduce one's risk of heart attack and/or stroke.   To reduce your LDL, Remember - more fruits and vegetables, more fish, and limit red meat and dairy products. More soy, nuts, beans, barley, lentils, oats and plant sterol ester enriched margarine instead of butter. I also encourage eliminating sugar and processed food. Remember, shop on the outside of the grocery store and visit your [employer]. If you would like to talk with me about dietary changes for your cholesterol, please let me know. We should recheck your cholesterol in 12 months.  Any  questions? Keep being awesome!!  Thank you for allowing me to participate in your care.  I appreciate you. Kindest regards, Lorey Pallett

## 2022-04-27 ENCOUNTER — Encounter: Payer: Self-pay | Admitting: Nurse Practitioner

## 2022-04-27 ENCOUNTER — Ambulatory Visit: Payer: Self-pay | Admitting: *Deleted

## 2022-04-27 ENCOUNTER — Telehealth: Payer: BC Managed Care – PPO | Admitting: Nurse Practitioner

## 2022-04-27 DIAGNOSIS — J0110 Acute frontal sinusitis, unspecified: Secondary | ICD-10-CM

## 2022-04-27 MED ORDER — METHYLPREDNISOLONE 4 MG PO TBPK: ORAL_TABLET | ORAL | 0 refills | Status: DC

## 2022-04-27 MED ORDER — AMOXICILLIN 500 MG PO CAPS: 500.0000 mg | ORAL_CAPSULE | Freq: Two times a day (BID) | ORAL | 0 refills | Status: AC

## 2022-04-27 NOTE — Telephone Encounter (Signed)
  Chief Complaint: medication request- sinus pressure Symptoms: sinus pressure, cough nasal drainage- changing in color Frequency: started Sunday- not better using OTC treatment Pertinent Negatives: Patient denies fever Disposition: '[]'$ ED /'[]'$ Urgent Care (no appt availability in office) / '[x]'$ Appointment(In office/virtual)/ '[]'$  [place] [facility]/ '[]'$ Home Care/ '[]'$ Refused Recommended Disposition /'[]'$ [facility]/ '[]'$  Follow-up with PCP Additional Notes: Patient would like medication called in if possible- she did make appointment for tomorrow- she was hoping for today- patient states if she can get treated someone sicker could have her appointment.

## 2022-04-27 NOTE — Telephone Encounter (Signed)
Summary: poss sinus inf   Pt states she has congestion, it is all in her head.  This has been over a week she has felt like this over a week.  She has pain and pressure. Since she saw Jolene earlier in the month, she is hoping to get something called in. [place], [place]         Reason for Disposition  [1] Sinus congestion (pressure, fullness) AND [2] present > 10 days  Answer Assessment - Initial Assessment Questions 1. LOCATION: "Where does it hurt?"      Pressure sinuses- face- around eyes 2. ONSET: "When did the sinus pain start?"  (e.g., hours, days)      Sunday 3. SEVERITY: "How bad is the pain?"   (Scale 1-10; mild, moderate or severe)   - MILD (1-3): doesn't interfere with normal activities    - MODERATE (4-7): interferes with normal activities (e.g., work or school) or awakens from sleep   - SEVERE (8-10): excruciating pain and patient unable to do any normal activities        moderate 4. RECURRENT SYMPTOM: "Have you ever had sinus problems before?" If Yes, ask: "When was the last time?" and "What happened that time?"      Not frequent- has been years 5. NASAL CONGESTION: "Is the nose blocked?" If Yes, ask: "Can you open it or must you breathe through your mouth?"     Not blocked 6. NASAL DISCHARGE: "Do you have discharge from your nose?" If so ask, "What color?"     Mostly clear- today changing to darker 7. FEVER: "Do you have a fever?" If Yes, ask: "What is it, how was it measured, and when did it start?"      no 8. OTHER SYMPTOMS: "Do you have any other symptoms?" (e.g., sore throat, cough, earache, difficulty breathing)     Teeth sensitive- pressure, cough  Protocols used: Sinus Pain or Congestion-A-AH

## 2022-04-27 NOTE — Telephone Encounter (Signed)
Pt scheduled  

## 2022-04-27 NOTE — Progress Notes (Signed)
LMP  (LMP Unknown)    Subjective:    Patient ID: Maria Ball, female    DOB: 01/11/1961, 61 y.o.   MRN: [id number]  HPI: Maria Ball is a 61 y.o. female  Chief Complaint  Patient presents with   Cough   UPPER RESPIRATORY TRACT INFECTION Worst symptom: symptoms started on Monday last week with a head cold.  Fever: no Cough: yes Shortness of breath: no Wheezing:  a little bit in bed at night Chest pain: no Chest tightness: no Chest congestion: no Nasal congestion: yes Runny nose: yes Post nasal drip: yes Sneezing: no Sore throat: yes Swollen glands: no Sinus pressure: yes Headache: yes Face pain: yes Toothache: yes- especially on her top teeth Ear pain: no bilateral Ear pressure: no bilateral Eyes red/itching:no Eye drainage/crusting: no  Vomiting: no Rash: no Fatigue: yes Sick contacts: no Strep contacts: no  Context: stable Recurrent sinusitis: no Relief with OTC cold/cough medications: yes  Treatments attempted: cold/sinus and mucinex    Relevant past medical, surgical, family and social history reviewed and updated as indicated. Interim medical history since our last visit reviewed. Allergies and medications reviewed and updated.  Review of Systems  Constitutional:  Positive for fatigue. Negative for fever.  HENT:  Positive for congestion, dental problem, postnasal drip, rhinorrhea, sinus pressure and sinus pain. Negative for ear pain, sneezing and sore throat.   Respiratory:  Positive for cough. Negative for shortness of breath and wheezing.   Cardiovascular:  Negative for chest pain.  Gastrointestinal:  Negative for vomiting.  Skin:  Negative for rash.  Neurological:  Positive for headaches.    Per HPI unless specifically indicated above     Objective:    LMP  (LMP Unknown)   Wt Readings from Last 3 Encounters:  03/30/22 189 lb (85.7 kg)  05/24/21 190 lb (86.2 kg)  03/29/21 194 lb 6.4 oz (88.2 kg)    Physical Exam Vitals  and nursing note reviewed.  HENT:     Head: Normocephalic.     Right Ear: Hearing normal.     Left Ear: Hearing normal.     Nose: Nose normal.  Eyes:     Pupils: Pupils are equal, round, and reactive to light.  Pulmonary:     Effort: Pulmonary effort is normal. No respiratory distress.  Neurological:     Mental Status: She is alert.  Psychiatric:        Mood and Affect: Mood normal.        Behavior: Behavior normal.        Thought Content: Thought content normal.        Judgment: Judgment normal.     Results for orders placed or performed in visit on 03/30/22  CBC with Differential/Platelet  Result Value Ref Range   WBC 6.3 3.4 - 10.8 x10E3/uL   RBC 5.05 3.77 - 5.28 x10E6/uL   Hemoglobin 13.2 11.1 - 15.9 g/dL   Hematocrit 42.9 34.0 - 46.6 %   MCV 85 79 - 97 fL   MCH 26.1 (L) 26.6 - 33.0 pg   MCHC 30.8 (L) 31.5 - 35.7 g/dL   RDW 13.2 11.7 - 15.4 %   Platelets 405 150 - 450 x10E3/uL   Neutrophils 56 Not Estab. %   Lymphs 36 Not Estab. %   Monocytes 6 Not Estab. %   Eos 1 Not Estab. %   Basos 1 Not Estab. %   Neutrophils Absolute 3.5 1.4 - 7.0 x10E3/uL   Lymphocytes Absolute  2.3 0.7 - 3.1 x10E3/uL   Monocytes Absolute 0.4 0.1 - 0.9 x10E3/uL   EOS (ABSOLUTE) 0.1 0.0 - 0.4 x10E3/uL   Basophils Absolute 0.1 0.0 - 0.2 x10E3/uL   Immature Granulocytes 0 Not Estab. %   Immature Grans (Abs) 0.0 0.0 - 0.1 x10E3/uL  Comprehensive metabolic panel  Result Value Ref Range   Glucose 90 70 - 99 mg/dL   BUN 10 8 - 27 mg/dL   Creatinine, Ser 0.67 0.57 - 1.00 mg/dL   eGFR 99 >59 mL/min/1.73   BUN/Creatinine Ratio 15 12 - 28   Sodium 141 134 - 144 mmol/L   Potassium 4.4 3.5 - 5.2 mmol/L   Chloride 102 96 - 106 mmol/L   CO2 24 20 - 29 mmol/L   Calcium 9.3 8.7 - 10.3 mg/dL   Total Protein 6.7 6.0 - 8.5 g/dL   Albumin 4.3 3.9 - 4.9 g/dL   Globulin, Total 2.4 1.5 - 4.5 g/dL   Albumin/Globulin Ratio 1.8 1.2 - 2.2   Bilirubin Total <0.2 0.0 - 1.2 mg/dL   Alkaline Phosphatase 204 (H)  44 - 121 IU/L   AST 18 0 - 40 IU/L   ALT 23 0 - 32 IU/L  Lipid Panel w/o Chol/HDL Ratio  Result Value Ref Range   Cholesterol, Total 201 (H) 100 - 199 mg/dL   Triglycerides 154 (H) 0 - 149 mg/dL   HDL 40 >39 mg/dL   VLDL Cholesterol Cal 28 5 - 40 mg/dL   LDL Chol Calc (NIH) 133 (H) 0 - 99 mg/dL  TSH  Result Value Ref Range   TSH 1.240 0.450 - 4.500 uIU/mL  VITAMIN D 25 Hydroxy (Vit-D Deficiency, Fractures)  Result Value Ref Range   Vit D, 25-Hydroxy 21.8 (L) 30.0 - 100.0 ng/mL  Gamma GT  Result Value Ref Range   GGT 17 0 - 60 IU/L      Assessment & Plan:   Problem List Items Addressed This Visit   None Visit Diagnoses     Acute non-recurrent frontal sinusitis    -  Primary   Complete course of antibiotics and prednisone. Continue with OTC symptom managment.  Stay hydrated and rest well.  FU if symptoms not improved.   Relevant Medications   amoxicillin (AMOXIL) 500 MG capsule   methylPREDNISolone (MEDROL DOSEPAK) 4 MG TBPK tablet        Follow up plan: Return if symptoms worsen or fail to improve.   This visit was completed via MyChart due to the restrictions of the COVID-19 pandemic. All issues as above were discussed and addressed. Physical exam was done as above through visual confirmation on MyChart. If it was felt that the patient should be evaluated in the office, they were directed there. The patient verbally consented to this visit. Location of the patient: Home Location of the provider: Office Those involved with this call:  Provider: Jon Billings, NP CMA: Yvonna Alanis, CMA Front Desk/Registration: Lynnell Catalan This encounter was conducted via video.  I spent 2 dedicated to the care of this patient on the date of this encounter to include previsit review of symptoms, plan of care and follow up, face to face time with the patient, and post visit ordering of testing.

## 2022-04-29 ENCOUNTER — Ambulatory Visit
Admission: RE | Admit: 2022-04-29 | Discharge: 2022-04-29 | Disposition: A | Discharge status: Home / Self Care | Payer: BC Managed Care – PPO | Source: Ambulatory Visit | Attending: Nurse Practitioner | Admitting: Nurse Practitioner

## 2022-04-29 DIAGNOSIS — Z1231 Encounter for screening mammogram for malignant neoplasm of breast: Secondary | ICD-10-CM | POA: Insufficient documentation

## 2022-05-03 NOTE — Progress Notes (Signed)
Contacted via MyChart   Normal mammogram, may repeat in one year:)

## 2022-05-04 ENCOUNTER — Encounter: Payer: Self-pay | Admitting: Nurse Practitioner

## 2022-05-04 ENCOUNTER — Ambulatory Visit: Payer: BC Managed Care – PPO | Admitting: Nurse Practitioner

## 2022-05-04 VITALS — BP 135/75 | HR 85 | Temp 98.2°F | Ht 65.98 in | Wt 186.3 lb

## 2022-05-04 DIAGNOSIS — E7800 Pure hypercholesterolemia, unspecified: Secondary | ICD-10-CM

## 2022-05-04 NOTE — Assessment & Plan Note (Signed)
Chronic, ASCVD 5.4% and LDL <190 -- right carotid calcifications noted on dental imaging, but per patient the dentist told her know change from 2018.  Continue without medication and focus on diet and exercise.  Lipid panel up to date.  Discussed possible doppler ultrasound carotids or CT calcium scoring in new year -- currently she has no symptoms and wishes to look into these.

## 2022-05-04 NOTE — Progress Notes (Signed)
BP 135/75   Pulse 85   Temp 98.2 F (36.8 C) (Oral)   Ht 5' 5.98" (1.676 m)   Wt 186 lb 4.8 oz (84.5 kg)   LMP  (LMP Unknown)   SpO2 99%   BMI 30.08 kg/m    Subjective:    Patient ID: Maria Ball, female    DOB: 09-Oct-1960, 61 y.o.   MRN: [id number]  HPI: Maria Ball is a 61 y.o. female  Chief Complaint  Patient presents with   Review xray results   HYPERLIPIDEMIA Last labs 03/30/22 LDL 133.  Dentist wrote to PCP and let them know of imaging noting calcification to right carotid 04/27/22 -- she told patient that there was no real difference in calcification from 2018 imaging -- no urgency, PCP was not made aware of this.  She is not having any symptoms at this time and no history of stroke.  Denies amaurosis fugax. Supplements: fish oil Aspirin:  no The 10-year ASCVD risk score (Arnett DK, et al., 2019) is: 5.4%   Values used to calculate the score:     Age: 79 years     Sex: Female     Is Non-Hispanic African American: No     Diabetic: No     Tobacco smoker: No     Systolic Blood Pressure: 568 mmHg     Is BP treated: No     HDL Cholesterol: 40 mg/dL     Total Cholesterol: 201 mg/dL Chest pain:  no Coronary artery disease:  no Family history CAD:  yes -- sister had stroke due to medication she was on Family history early CAD:  no   Relevant past medical, surgical, family and social history reviewed and updated as indicated. Interim medical history since our last visit reviewed. Allergies and medications reviewed and updated.  Review of Systems  Constitutional:  Negative for activity change, appetite change, diaphoresis, fatigue and fever.  Respiratory:  Negative for cough, chest tightness and shortness of breath.   Cardiovascular:  Negative for chest pain, palpitations and leg swelling.  Gastrointestinal: Negative.   Neurological: Negative.   Psychiatric/Behavioral: Negative.      Per HPI unless specifically indicated above     Objective:     BP 135/75   Pulse 85   Temp 98.2 F (36.8 C) (Oral)   Ht 5' 5.98" (1.676 m)   Wt 186 lb 4.8 oz (84.5 kg)   LMP  (LMP Unknown)   SpO2 99%   BMI 30.08 kg/m   Wt Readings from Last 3 Encounters:  05/04/22 186 lb 4.8 oz (84.5 kg)  03/30/22 189 lb (85.7 kg)  05/24/21 190 lb (86.2 kg)    Physical Exam Vitals and nursing note reviewed.  Constitutional:      General: She is awake. She is not in acute distress.    Appearance: She is well-developed and well-groomed. She is obese. She is not ill-appearing or toxic-appearing.  HENT:     Head: Normocephalic.     Right Ear: Hearing normal.     Left Ear: Hearing normal.  Eyes:     General: Lids are normal.        Right eye: No discharge.        Left eye: No discharge.     Conjunctiva/sclera: Conjunctivae normal.     Pupils: Pupils are equal, round, and reactive to light.  Neck:     Vascular: No carotid bruit.  Cardiovascular:     Rate and Rhythm: Normal  rate and regular rhythm.     Heart sounds: Normal heart sounds. No murmur heard.    No gallop.  Pulmonary:     Effort: Pulmonary effort is normal. No accessory muscle usage or respiratory distress.     Breath sounds: Normal breath sounds.  Abdominal:     General: Bowel sounds are normal.     Palpations: Abdomen is soft. There is no hepatomegaly or splenomegaly.  Musculoskeletal:     Cervical back: Normal range of motion and neck supple.     Right lower leg: No edema.     Left lower leg: No edema.  Skin:    General: Skin is warm and dry.  Neurological:     Mental Status: She is alert and oriented to person, place, and time.  Psychiatric:        Attention and Perception: Attention normal.        Mood and Affect: Mood normal.        Speech: Speech normal.        Behavior: Behavior normal. Behavior is cooperative.        Thought Content: Thought content normal.     Results for orders placed or performed in visit on 03/30/22  CBC with Differential/Platelet  Result Value  Ref Range   WBC 6.3 3.4 - 10.8 x10E3/uL   RBC 5.05 3.77 - 5.28 x10E6/uL   Hemoglobin 13.2 11.1 - 15.9 g/dL   Hematocrit 42.9 34.0 - 46.6 %   MCV 85 79 - 97 fL   MCH 26.1 (L) 26.6 - 33.0 pg   MCHC 30.8 (L) 31.5 - 35.7 g/dL   RDW 13.2 11.7 - 15.4 %   Platelets 405 150 - 450 x10E3/uL   Neutrophils 56 Not Estab. %   Lymphs 36 Not Estab. %   Monocytes 6 Not Estab. %   Eos 1 Not Estab. %   Basos 1 Not Estab. %   Neutrophils Absolute 3.5 1.4 - 7.0 x10E3/uL   Lymphocytes Absolute 2.3 0.7 - 3.1 x10E3/uL   Monocytes Absolute 0.4 0.1 - 0.9 x10E3/uL   EOS (ABSOLUTE) 0.1 0.0 - 0.4 x10E3/uL   Basophils Absolute 0.1 0.0 - 0.2 x10E3/uL   Immature Granulocytes 0 Not Estab. %   Immature Grans (Abs) 0.0 0.0 - 0.1 x10E3/uL  Comprehensive metabolic panel  Result Value Ref Range   Glucose 90 70 - 99 mg/dL   BUN 10 8 - 27 mg/dL   Creatinine, Ser 0.67 0.57 - 1.00 mg/dL   eGFR 99 >59 mL/min/1.73   BUN/Creatinine Ratio 15 12 - 28   Sodium 141 134 - 144 mmol/L   Potassium 4.4 3.5 - 5.2 mmol/L   Chloride 102 96 - 106 mmol/L   CO2 24 20 - 29 mmol/L   Calcium 9.3 8.7 - 10.3 mg/dL   Total Protein 6.7 6.0 - 8.5 g/dL   Albumin 4.3 3.9 - 4.9 g/dL   Globulin, Total 2.4 1.5 - 4.5 g/dL   Albumin/Globulin Ratio 1.8 1.2 - 2.2   Bilirubin Total <0.2 0.0 - 1.2 mg/dL   Alkaline Phosphatase 204 (H) 44 - 121 IU/L   AST 18 0 - 40 IU/L   ALT 23 0 - 32 IU/L  Lipid Panel w/o Chol/HDL Ratio  Result Value Ref Range   Cholesterol, Total 201 (H) 100 - 199 mg/dL   Triglycerides 154 (H) 0 - 149 mg/dL   HDL 40 >39 mg/dL   VLDL Cholesterol Cal 28 5 - 40 mg/dL   LDL Chol Calc (  NIH) 133 (H) 0 - 99 mg/dL  TSH  Result Value Ref Range   TSH 1.240 0.450 - 4.500 uIU/mL  VITAMIN D 25 Hydroxy (Vit-D Deficiency, Fractures)  Result Value Ref Range   Vit D, 25-Hydroxy 21.8 (L) 30.0 - 100.0 ng/mL  Gamma GT  Result Value Ref Range   GGT 17 0 - 60 IU/L      Assessment & Plan:   Problem List Items Addressed This Visit        Other   Elevated low density lipoprotein (LDL) cholesterol level - Primary    Chronic, ASCVD 5.4% and LDL <190 -- right carotid calcifications noted on dental imaging, but per patient the dentist told her know change from 2018.  Continue without medication and focus on diet and exercise.  Lipid panel up to date.  Discussed possible doppler ultrasound carotids or CT calcium scoring in new year -- currently she has no symptoms and wishes to look into these.        Follow up plan: Return for as scheduled.

## 2022-05-04 NOTE — Patient Instructions (Addendum)
CT Coronary Calcium Screening  Dyslipidemia Dyslipidemia is an imbalance of waxy, fat-like substances (lipids) in the blood. The body needs lipids in small amounts. Dyslipidemia often involves a high level of cholesterol or triglycerides, which are types of lipids. Common forms of dyslipidemia include: High levels of LDL cholesterol. LDL is the type of cholesterol that causes fatty deposits (plaques) to build up in the blood vessels that carry blood away from the heart (arteries). Low levels of HDL cholesterol. HDL cholesterol is the type of cholesterol that protects against heart disease. High levels of HDL remove the LDL buildup from arteries. High levels of triglycerides. Triglycerides are a fatty substance in the blood that is linked to a buildup of plaques in the arteries. What are the causes? There are two main types of dyslipidemia: primary and secondary. Primary dyslipidemia is caused by changes (mutations) in genes that are passed down through families (inherited). These mutations cause several types of dyslipidemia. Secondary dyslipidemia may be caused by various risk factors that can lead to the disease, such as lifestyle choices and certain medical conditions. What increases the risk? You are more likely to develop this condition if you are an older man or if you are a woman who has gone through menopause. Other risk factors include: Having a family history of dyslipidemia. Taking certain medicines, including birth control pills, steroids, some diuretics, and beta-blockers. Eating a diet high in saturated fat. Smoking cigarettes or excessive alcohol intake. Having certain medical conditions such as diabetes, polycystic ovary syndrome (PCOS), kidney disease, liver disease, or hypothyroidism. Not exercising regularly. Being overweight or obese with too much belly fat. What are the signs or symptoms? In most cases, dyslipidemia does not usually cause any symptoms. In severe cases, very  high lipid levels can cause: Fatty bumps under the skin (xanthomas). A white or gray ring around the black center (pupil) of the eye. Very high triglyceride levels can cause inflammation of the pancreas (pancreatitis). How is this diagnosed? Your health care provider may diagnose dyslipidemia based on a routine blood test (fasting blood test). Because most people do not have symptoms of the condition, this blood testing (lipid profile) is done on adults age 22 and older and is repeated every 4-6 years. This test checks: Total cholesterol. This measures the total amount of cholesterol in your blood, including LDL cholesterol, HDL cholesterol, and triglycerides. A healthy number is below 200 mg/dL (5.17 mmol/L). LDL cholesterol. The target number for LDL cholesterol is different for each person, depending on individual risk factors. A healthy number is usually below 100 mg/dL (2.59 mmol/L). Ask your health care provider what your LDL cholesterol should be. HDL cholesterol. An HDL level of 60 mg/dL (1.55 mmol/L) or higher is best because it helps to protect against heart disease. A number below 40 mg/dL (1.03 mmol/L) for men or below 50 mg/dL (1.29 mmol/L) for women increases the risk for heart disease. Triglycerides. A healthy triglyceride number is below 150 mg/dL (1.69 mmol/L). If your lipid profile is abnormal, your health care provider may do other blood tests. How is this treated? Treatment depends on the type of dyslipidemia that you have and your other risk factors for heart disease and stroke. Your health care provider will have a target range for your lipid levels based on this information. Treatment for dyslipidemia starts with lifestyle changes, such as diet and exercise. Your health care provider may recommend that you: Get regular exercise. Make changes to your diet. Quit smoking if you smoke. Limit  your alcohol intake. If diet changes and exercise do not help you reach your goals, your  health care provider may also prescribe medicine to lower lipids. The most commonly prescribed type of medicine lowers your LDL cholesterol (statin drug). If you have a high triglyceride level, your provider may prescribe another type of drug (fibrate) or an omega-3 fish oil supplement, or both. Follow these instructions at home: Eating and drinking  Follow instructions from your health care provider or dietitian about eating or drinking restrictions. Eat a healthy diet as told by your health care provider. This can help you reach and maintain a healthy weight, lower your LDL cholesterol, and raise your HDL cholesterol. This may include: Limiting your calories, if you are overweight. Eating more fruits, vegetables, whole grains, fish, and lean meats. Limiting saturated fat, trans fat, and cholesterol. Do not drink alcohol if: Your health care provider tells you not to drink. You are pregnant, may be pregnant, or are planning to become pregnant. If you drink alcohol: Limit how much you have to: 0-1 drink a day for women. 0-2 drinks a day for men. Know how much alcohol is in your drink. In the [place] one drink equals one 12 oz bottle of beer (355 mL), one 5 oz glass of wine (148 mL), or one 1 oz glass of hard liquor (44 mL). Activity Get regular exercise. Start an exercise and strength training program as told by your health care provider. Ask your health care provider what activities are safe for you. Your health care provider may recommend: 30 minutes of aerobic activity 4-6 days a week. Brisk walking is an example of aerobic activity. Strength training 2 days a week. General instructions Do not use any products that contain nicotine or tobacco. These products include cigarettes, chewing tobacco, and vaping devices, such as e-cigarettes. If you need help quitting, ask your health care provider. Take over-the-counter and prescription medicines only as told by your health care provider. This  includes supplements. Keep all follow-up visits. This is important. Contact a health care provider if: You are having trouble sticking to your exercise or diet plan. You are struggling to quit smoking or to control your use of alcohol. Summary Dyslipidemia often involves a high level of cholesterol or triglycerides, which are types of lipids. Treatment depends on the type of dyslipidemia that you have and your other risk factors for heart disease and stroke. Treatment for dyslipidemia starts with lifestyle changes, such as diet and exercise. Your health care provider may prescribe medicine to lower lipids. This information is not intended to replace advice given to you by your health care provider. Make sure you discuss any questions you have with your health care provider. Document Revised: 11/26/2021 Document Reviewed: 06/29/2020 Elsevier Patient Education  [place].

## 2022-06-23 ENCOUNTER — Ambulatory Visit: Payer: BC Managed Care – PPO | Admitting: Dermatology

## 2022-07-07 ENCOUNTER — Ambulatory Visit: Payer: BC Managed Care – PPO | Admitting: Dermatology

## 2022-08-24 ENCOUNTER — Ambulatory Visit: Payer: No Typology Code available for payment source | Admitting: Dermatology

## 2022-08-24 VITALS — BP 122/75

## 2022-08-24 DIAGNOSIS — Z1283 Encounter for screening for malignant neoplasm of skin: Secondary | ICD-10-CM | POA: Diagnosis not present

## 2022-08-24 DIAGNOSIS — D229 Melanocytic nevi, unspecified: Secondary | ICD-10-CM

## 2022-08-24 DIAGNOSIS — Z85828 Personal history of other malignant neoplasm of skin: Secondary | ICD-10-CM

## 2022-08-24 DIAGNOSIS — L821 Other seborrheic keratosis: Secondary | ICD-10-CM

## 2022-08-24 DIAGNOSIS — L814 Other melanin hyperpigmentation: Secondary | ICD-10-CM | POA: Diagnosis not present

## 2022-08-24 DIAGNOSIS — L578 Other skin changes due to chronic exposure to nonionizing radiation: Secondary | ICD-10-CM | POA: Diagnosis not present

## 2022-08-24 NOTE — Patient Instructions (Signed)
Due to recent changes in healthcare laws, you may see results of your pathology and/or laboratory studies on MyChart before the doctors have had a chance to review them. We understand that in some cases there may be results that are confusing or concerning to you. Please understand that not all results are received at the same time and often the doctors may need to interpret multiple results in order to provide you with the best plan of care or course of treatment. Therefore, we ask that you please give us 2 business days to thoroughly review all your results before contacting the office for clarification. Should we see a critical lab result, you will be contacted sooner.   If You Need Anything After Your Visit  If you have any questions or concerns for your doctor, please call our main line at [phone] and press option 4 to reach your doctor's medical assistant. If no one answers, please leave a voicemail as directed and we will return your call as soon as possible. Messages left after 4 pm will be answered the following business day.   You may also send us a message via MyChart. We typically respond to MyChart messages within 1-2 business days.  For prescription refills, please ask your pharmacy to contact our office. Our fax number is [phone].  If you have an urgent issue when the clinic is closed that cannot wait until the next business day, you can page your doctor at the number below.    Please note that while we do our best to be available for urgent issues outside of office hours, we are not available 24/7.   If you have an urgent issue and are unable to reach us, you may choose to seek medical care at your doctor's office, retail clinic, urgent care center, or emergency room.  If you have a medical emergency, please immediately call 911 or go to the emergency department.  Pager Numbers  - Dr. Kowalski: [phone]  - Dr. Moye: [phone]  - Dr. Stewart:  [phone]  In the event of inclement weather, please call our main line at [phone] for an update on the status of any delays or closures.  Dermatology Medication Tips: Please keep the boxes that topical medications come in in order to help keep track of the instructions about where and how to use these. Pharmacies typically print the medication instructions only on the boxes and not directly on the medication tubes.   If your medication is too expensive, please contact our office at [phone] option 4 or send us a message through MyChart.   We are unable to tell what your co-pay for medications will be in advance as this is different depending on your insurance coverage. However, we may be able to find a substitute medication at lower cost or fill out paperwork to get insurance to cover a needed medication.   If a prior authorization is required to get your medication covered by your insurance company, please allow us 1-2 business days to complete this process.  Drug prices often vary depending on where the prescription is filled and some pharmacies may offer cheaper prices.  The website www.goodrx.com contains coupons for medications through different pharmacies. The prices here do not account for what the cost may be with help from insurance (it may be cheaper with your insurance), but the website can give you the price if you did not use any insurance.  - You can print the associated coupon and take it with   your prescription to the pharmacy.  - You may also stop by our office during regular business hours and pick up a GoodRx coupon card.  - If you need your prescription sent electronically to a different pharmacy, notify our office through [place] MyChart or by phone at [phone] option 4.     Si Usted Necesita Algo Despus de Su Visita  Tambin puede enviarnos un mensaje a travs de MyChart. Por lo general respondemos a los mensajes de MyChart en el transcurso de 1 a 2  das hbiles.  Para renovar recetas, por favor pida a su farmacia que se ponga en contacto con nuestra oficina. Nuestro nmero de fax es el [phone].  Si tiene un asunto urgente cuando la clnica est cerrada y que no puede esperar hasta el siguiente da hbil, puede llamar/localizar a su doctor(a) al nmero que aparece a continuacin.   Por favor, tenga en cuenta que aunque hacemos todo lo posible para estar disponibles para asuntos urgentes fuera del horario de oficina, no estamos disponibles las 24 horas del da, los 7 das de la semana.   Si tiene un problema urgente y no puede comunicarse con nosotros, puede optar por buscar atencin mdica  en el consultorio de su doctor(a), en una clnica privada, en un centro de atencin urgente o en una sala de emergencias.  Si tiene una emergencia mdica, por favor llame inmediatamente al 911 o vaya a la sala de emergencias.  Nmeros de bper  - Dr. Kowalski: [phone]  - Dra. Moye: [phone]  - Dra. Stewart: [phone]  En caso de inclemencias del tiempo, por favor llame a nuestra lnea principal al [phone] para una actualizacin sobre el estado de cualquier retraso o cierre.  Consejos para la medicacin en dermatologa: Por favor, guarde las cajas en las que vienen los medicamentos de uso tpico para ayudarle a seguir las instrucciones sobre dnde y cmo usarlos. Las farmacias generalmente imprimen las instrucciones del medicamento slo en las cajas y no directamente en los tubos del medicamento.   Si su medicamento es muy caro, por favor, pngase en contacto con nuestra oficina llamando al [phone] y presione la opcin 4 o envenos un mensaje a travs de MyChart.   No podemos decirle cul ser su copago por los medicamentos por adelantado ya que esto es diferente dependiendo de la cobertura de su seguro. Sin embargo, es posible que podamos encontrar un medicamento sustituto a menor costo o llenar un formulario para que el  seguro cubra el medicamento que se considera necesario.   Si se requiere una autorizacin previa para que su compaa de seguros cubra su medicamento, por favor permtanos de 1 a 2 das hbiles para completar este proceso.  Los precios de los medicamentos varan con frecuencia dependiendo del lugar de dnde se surte la receta y alguna farmacias pueden ofrecer precios ms baratos.  El sitio web www.goodrx.com tiene cupones para medicamentos de diferentes farmacias. Los precios aqu no tienen en cuenta lo que podra costar con la ayuda del seguro (puede ser ms barato con su seguro), pero el sitio web puede darle el precio si no utiliz ningn seguro.  - Puede imprimir el cupn correspondiente y llevarlo con su receta a la farmacia.  - Tambin puede pasar por nuestra oficina durante el horario de atencin regular y recoger una tarjeta de cupones de GoodRx.  - Si necesita que su receta se enve electrnicamente a una farmacia diferente, informe a nuestra oficina a travs de MyChart [facility]   o por telfono llamando al [phone] y presione la opcin 4.  

## 2022-08-24 NOTE — Progress Notes (Signed)
   Follow-Up Visit   Subjective  Maria Ball is a 62 y.o. female who presents for the following: Skin Cancer Screening and Full Body Skin Exam  The patient presents for Total-Body Skin Exam (TBSE) for skin cancer screening and mole check. The patient has spots, moles and lesions to be evaluated, some may be new or changing and the patient has concerns that these could be cancer.    The following portions of the chart were reviewed this encounter and updated as appropriate: medications, allergies, medical history  Review of Systems:  No other skin or systemic complaints except as noted in HPI or Assessment and Plan.  Objective  Well appearing patient in no apparent distress; mood and affect are within normal limits.  A full examination was performed including scalp, head, eyes, ears, nose, lips, neck, chest, axillae, abdomen, back, buttocks, bilateral upper extremities, bilateral lower extremities, hands, feet, fingers, toes, fingernails, and toenails. All findings within normal limits unless otherwise noted below.   Relevant physical exam findings are noted in the Assessment and Plan.    Assessment & Plan   HISTORY OF BASAL CELL CARCINOMA OF THE SKIN - No evidence of recurrence today - Recommend regular full body skin exams - Recommend daily broad spectrum sunscreen SPF 30+ to sun-exposed areas, reapply every 2 hours as needed.  - Call if any new or changing lesions are noted between office visits   LENTIGINES, SEBORRHEIC KERATOSES, HEMANGIOMAS - Benign normal skin lesions - Benign-appearing - Call for any changes  MELANOCYTIC NEVI - Tan-brown and/or pink-flesh-colored symmetric macules and papules - Benign appearing on exam today - Observation - Call clinic for new or changing moles - Recommend daily use of broad spectrum spf 30+ sunscreen to sun-exposed areas.   ACTINIC DAMAGE - Chronic condition, secondary to cumulative UV/sun exposure - diffuse scaly  erythematous macules with underlying dyspigmentation - Recommend daily broad spectrum sunscreen SPF 30+ to sun-exposed areas, reapply every 2 hours as needed.  - Staying in the shade or wearing long sleeves, sun glasses (UVA+UVB protection) and wide brim hats (4-inch brim around the entire circumference of the hat) are also recommended for sun protection.  - Call for new or changing lesions.  SKIN CANCER SCREENING PERFORMED TODAY.    Return in about 1 year (around 08/24/2023) for TBSE.  I, Joanie Coddington, CMA, am acting as scribe for Armida Sans, MD .   Documentation: I have reviewed the above documentation for accuracy and completeness, and I agree with the above.  Armida Sans, MD

## 2022-09-05 ENCOUNTER — Encounter: Payer: Self-pay | Admitting: Dermatology

## 2023-04-02 NOTE — Patient Instructions (Signed)
Be Involved in Caring For Your Health:  Taking Medications When medications are taken as directed, they can greatly improve your health. But if they are not taken as prescribed, they may not work. In some cases, not taking them correctly can be harmful. To help ensure your treatment remains effective and safe, understand your medications and how to take them. Bring your medications to each visit for review by your provider.  Your lab results, notes, and after visit summary will be available on My Chart. We strongly encourage you to use this feature. If lab results are abnormal the clinic will contact you with the appropriate steps. If the clinic does not contact you assume the results are satisfactory. You can always view your results on My Chart. If you have questions regarding your health or results, please contact the clinic during office hours. You can also ask questions on My Chart.  We at [facility] are grateful that you chose [place] to provide your care. We strive to provide evidence-based and compassionate care and are always looking for feedback. If you get a survey from the clinic please complete this so we can hear your opinions.  Healthy Eating, Adult Healthy eating may help you get and keep a healthy body weight, reduce the risk of chronic disease, and live a long and productive life. It is important to follow a healthy eating pattern. Your nutritional and calorie needs should be met mainly by different nutrient-rich foods. What are tips for following this plan? Reading food labels Read labels and choose the following: Reduced or low sodium products. Juices with 100% fruit juice. Foods with low saturated fats (<3 g per serving) and high polyunsaturated and monounsaturated fats. Foods with whole grains, such as whole wheat, cracked wheat, brown rice, and wild rice. Whole grains that are fortified with folic acid. This is recommended for females who are pregnant or who want to  become pregnant. Read labels and do not eat or drink the following: Foods or drinks with added sugars. These include foods that contain brown sugar, corn sweetener, corn syrup, dextrose, fructose, glucose, high-fructose corn syrup, honey, invert sugar, lactose, malt syrup, maltose, molasses, raw sugar, sucrose, trehalose, or turbinado sugar. Limit your intake of added sugars to less than 10% of your total daily calories. Do not eat more than the following amounts of added sugar per day: 6 teaspoons (25 g) for females. 9 teaspoons (38 g) for males. Foods that contain processed or refined starches and grains. Refined grain products, such as white flour, degermed cornmeal, white bread, and white rice. Shopping Choose nutrient-rich snacks, such as vegetables, whole fruits, and nuts. Avoid high-calorie and high-sugar snacks, such as potato chips, fruit snacks, and candy. Use oil-based dressings and spreads on foods instead of solid fats such as butter, margarine, sour cream, or cream cheese. Limit pre-made sauces, mixes, and "instant" products such as flavored rice, instant noodles, and ready-made pasta. Try more plant-protein sources, such as tofu, tempeh, black beans, edamame, lentils, nuts, and seeds. Explore eating plans such as the Mediterranean diet or vegetarian diet. Try heart-healthy dips made with beans and healthy fats like hummus and guacamole. Vegetables go great with these. Cooking Use oil to saut or stir-fry foods instead of solid fats such as butter, margarine, or lard. Try baking, boiling, grilling, or broiling instead of frying. Remove the fatty part of meats before cooking. Steam vegetables in water or broth. Meal planning  At meals, imagine dividing your plate into fourths: One-half of  your plate is fruits and vegetables. One-fourth of your plate is whole grains. One-fourth of your plate is protein, especially lean meats, poultry, eggs, tofu, beans, or nuts. Include low-fat  dairy as part of your daily diet. Lifestyle Choose healthy options in all settings, including home, work, school, restaurants, or stores. Prepare your food safely: Wash your hands after handling raw meats. Where you prepare food, keep surfaces clean by regularly washing with hot, soapy water. Keep raw meats separate from ready-to-eat foods, such as fruits and vegetables. Cook seafood, meat, poultry, and eggs to the recommended temperature. Get a food thermometer. Store foods at safe temperatures. In general: Keep cold foods at 48F (4.4C) or below. Keep hot foods at 148F (60C) or above. Keep your freezer at [facility] (-17.8C) or below. Foods are not safe to eat if they have been between the temperatures of 40-148F (4.4-60C) for more than 2 hours. What foods should I eat? Fruits Aim to eat 1-2 cups of fresh, canned (in natural juice), or frozen fruits each day. One cup of fruit equals 1 small apple, 1 large banana, 8 large strawberries, 1 cup (237 g) canned fruit,  cup (82 g) dried fruit, or 1 cup (240 mL) 100% juice. Vegetables Aim to eat 2-4 cups of fresh and frozen vegetables each day, including different varieties and colors. One cup of vegetables equals 1 cup (91 g) broccoli or cauliflower florets, 2 medium carrots, 2 cups (150 g) raw, leafy greens, 1 large tomato, 1 large bell pepper, 1 large sweet potato, or 1 medium white potato. Grains Aim to eat 5-10 ounce-equivalents of whole grains each day. Examples of 1 ounce-equivalent of grains include 1 slice of bread, 1 cup (40 g) ready-to-eat cereal, 3 cups (24 g) popcorn, or  cup (93 g) cooked rice. Meats and other proteins Try to eat 5-7 ounce-equivalents of protein each day. Examples of 1 ounce-equivalent of protein include 1 egg,  oz nuts (12 almonds, 24 pistachios, or 7 walnut halves), 1/4 cup (90 g) cooked beans, 6 tablespoons (90 g) hummus or 1 tablespoon (16 g) peanut butter. A cut of meat or fish that is the size of a deck of  cards is about 3-4 ounce-equivalents (85 g). Of the protein you eat each week, try to have at least 8 sounce (227 g) of seafood. This is about 2 servings per week. This includes salmon, trout, herring, sardines, and anchovies. Dairy Aim to eat 3 cup-equivalents of fat-free or low-fat dairy each day. Examples of 1 cup-equivalent of dairy include 1 cup (240 mL) milk, 8 ounces (250 g) yogurt, 1 ounces (44 g) natural cheese, or 1 cup (240 mL) fortified soy milk. Fats and oils Aim for about 5 teaspoons (21 g) of fats and oils per day. Choose monounsaturated fats, such as canola and olive oils, mayonnaise made with olive oil or avocado oil, avocados, peanut butter, and most nuts, or polyunsaturated fats, such as sunflower, corn, and soybean oils, walnuts, pine nuts, sesame seeds, sunflower seeds, and flaxseed. Beverages Aim for 6 eight-ounce glasses of water per day. Limit coffee to 3-5 eight-ounce cups per day. Limit caffeinated beverages that have added calories, such as soda and energy drinks. If you drink alcohol: Limit how much you have to: 0-1 drink a day if you are female. 0-2 drinks a day if you are female. Know how much alcohol is in your drink. In the [place] one drink is one 12 oz bottle of beer (355 mL), one 5 oz glass of wine (  148 mL), or one 1 oz glass of hard liquor (44 mL). Seasoning and other foods Try not to add too much salt to your food. Try using herbs and spices instead of salt. Try not to add sugar to food. This information is based on [place] nutrition guidelines. To learn more, visit [employer].be. Exact amounts may vary. You may need different amounts. This information is not intended to replace advice given to you by your health care provider. Make sure you discuss any questions you have with your health care provider. Document Revised: 01/24/2022 Document Reviewed: 01/24/2022 Elsevier Patient Education  2024 [employer].

## 2023-04-03 ENCOUNTER — Encounter: Payer: Self-pay | Admitting: Nurse Practitioner

## 2023-04-03 ENCOUNTER — Ambulatory Visit (INDEPENDENT_AMBULATORY_CARE_PROVIDER_SITE_OTHER): Payer: No Typology Code available for payment source | Admitting: Nurse Practitioner

## 2023-04-03 VITALS — BP 133/69 | HR 84 | Temp 98.3°F | Ht 67.2 in | Wt 194.6 lb

## 2023-04-03 DIAGNOSIS — I10 Essential (primary) hypertension: Secondary | ICD-10-CM

## 2023-04-03 DIAGNOSIS — Z6830 Body mass index (BMI) 30.0-30.9, adult: Secondary | ICD-10-CM

## 2023-04-03 DIAGNOSIS — Z23 Encounter for immunization: Secondary | ICD-10-CM | POA: Diagnosis not present

## 2023-04-03 DIAGNOSIS — G8929 Other chronic pain: Secondary | ICD-10-CM

## 2023-04-03 DIAGNOSIS — R748 Abnormal levels of other serum enzymes: Secondary | ICD-10-CM | POA: Diagnosis not present

## 2023-04-03 DIAGNOSIS — E559 Vitamin D deficiency, unspecified: Secondary | ICD-10-CM | POA: Diagnosis not present

## 2023-04-03 DIAGNOSIS — Z1231 Encounter for screening mammogram for malignant neoplasm of breast: Secondary | ICD-10-CM

## 2023-04-03 DIAGNOSIS — E66811 Obesity, class 1: Secondary | ICD-10-CM

## 2023-04-03 DIAGNOSIS — E7800 Pure hypercholesterolemia, unspecified: Secondary | ICD-10-CM

## 2023-04-03 DIAGNOSIS — M25552 Pain in left hip: Secondary | ICD-10-CM

## 2023-04-03 DIAGNOSIS — E6609 Other obesity due to excess calories: Secondary | ICD-10-CM

## 2023-04-03 DIAGNOSIS — Z0000 Encounter for general adult medical examination without abnormal findings: Secondary | ICD-10-CM

## 2023-04-03 NOTE — Assessment & Plan Note (Signed)
Ongoing, stable. Has made diet changes over past 3 years with benefit.  BP below goal.  No medications at this time.  Continue to monitor and initiate as needed.  Labs today: CBC, CMP, TSH.

## 2023-04-03 NOTE — Progress Notes (Signed)
BP 133/69   Pulse 84   Temp 98.3 F (36.8 C) (Oral)   Ht 5' 7.2" (1.707 m)   Wt 194 lb 9.6 oz (88.3 kg)   LMP  (LMP Unknown)   SpO2 98%   BMI 30.30 kg/m    Subjective:    Patient ID: Maria Ball, female    DOB: 1960-08-05, 62 y.o.   MRN: [id number]  HPI: Maria Ball is a 62 y.o. female presenting on 04/03/2023 for comprehensive medical examination. Current medical complaints include:none  She currently lives with: self Menopausal Symptoms: no  History of elevation in Alkaline phosphatase.  Continues on Vitamin D supplement daily. Would like referral to ortho for chronic left hip pain for 2 months.  The 10-year ASCVD risk score (Arnett DK, et al., 2019) is: 5.7%   Values used to calculate the score:     Age: 26 years     Sex: Female     Is Non-Hispanic African American: No     Diabetic: No     Tobacco smoker: No     Systolic Blood Pressure: 133 mmHg     Is BP treated: No     HDL Cholesterol: 40 mg/dL     Total Cholesterol: 201 mg/dL   Functional Status Survey: Is the patient deaf or have difficulty hearing?: No Does the patient have difficulty seeing, even when wearing glasses/contacts?: No Does the patient have difficulty concentrating, remembering, or making decisions?: No Does the patient have difficulty walking or climbing stairs?: No Does the patient have difficulty dressing or bathing?: No Does the patient have difficulty doing errands alone such as visiting a doctor's office or shopping?: No   Depression Screen done today and results listed below:     04/03/2023    8:14 AM 03/30/2022    8:30 AM 03/29/2021    2:29 PM 03/27/2020    8:01 AM 03/26/2019    8:29 AM  Depression screen PHQ 2/9  Decreased Interest 0 0 0 0 0  Down, Depressed, Hopeless 0 0 0 0 0  PHQ - 2 Score 0 0 0 0 0  Altered sleeping 0 0 0  0  Tired, decreased energy 0 0 0  0  Change in appetite 0 0 0  0  Feeling bad or failure about yourself  0 0 0  0  Trouble  concentrating 0 0 0  0  Moving slowly or fidgety/restless 0 0 0  0  Suicidal thoughts 0 0 0  0  PHQ-9 Score 0 0 0  0  Difficult doing work/chores Not difficult at all Not difficult at all Not difficult at all        04/03/2023    8:14 AM 03/30/2022    8:30 AM 03/29/2021    2:29 PM 03/26/2019    8:28 AM  GAD 7 : Generalized Anxiety Score  Nervous, Anxious, on Edge 0 0 0 0  Control/stop worrying 0 0 0 0  Worry too much - different things 0 0 0 0  Trouble relaxing 0 0 0 0  Restless 0 0 0 0  Easily annoyed or irritable 0 0 0 0  Afraid - awful might happen 0 0 0 0  Total GAD 7 Score 0 0 0 0  Anxiety Difficulty Not difficult at all Not difficult at all Not difficult at all       03/26/2019    8:28 AM 03/29/2021    2:29 PM 03/30/2022    8:30 AM 03/30/2022  8:31 AM 04/03/2023    8:13 AM  Fall Risk  Falls in the past year? 0 0 1 1 0  Was there an injury with Fall? 0 0 1 0 0  Fall Risk Category Calculator 0 0 2 1 0  Fall Risk Category (Retired) Low Low Moderate Low   (RETIRED) Patient Fall Risk Level Low fall risk Low fall risk  Low fall risk   Patient at Risk for Falls Due to  No Fall Risks History of fall(s) History of fall(s) No Fall Risks  Fall risk Follow up  Falls evaluation completed Falls evaluation completed Falls prevention discussed Falls evaluation completed    Past Medical History:  Past Medical History:  Diagnosis Date   Alkaline phosphatase elevation    Basal cell carcinoma 02/17/2021   L of midline sup forehead - ED&C   Depression    Depression 12/11/2014   DVT (deep venous thrombosis) (HCC)    while taking OCP in the 1980's   Hypertension    Shingles    Shingles     Surgical History:  Past Surgical History:  Procedure Laterality Date   COLONOSCOPY WITH PROPOFOL N/A 04/16/2018   Procedure: COLONOSCOPY WITH PROPOFOL;  Surgeon: Wyline Mood, MD;  Location: [facility] ENDOSCOPY;  Service: Gastroenterology;  Laterality: N/A;   COLONOSCOPY WITH PROPOFOL N/A  05/24/2021   Procedure: COLONOSCOPY WITH PROPOFOL;  Surgeon: Toney Reil, MD;  Location: [facility] ENDOSCOPY;  Service: Gastroenterology;  Laterality: N/A;   DILATION AND CURETTAGE OF UTERUS     TONSILLECTOMY      Medications:  Current Outpatient Medications on File Prior to Visit  Medication Sig   Cholecalciferol (VITAMIN D3) 25 MCG (1000 UT) CAPS Take 2 capsules by mouth daily.   valACYclovir (VALTREX) 1000 MG tablet Take 1 tablet (1,000 mg total) by mouth daily.   No current facility-administered medications on file prior to visit.    Allergies:  No Known Allergies  Social History:  Social History   Socioeconomic History   Marital status: Divorced    Spouse name: Not on file   Number of children: Not on file   Years of education: Not on file   Highest education level: Not on file  Occupational History   Not on file  Tobacco Use   Smoking status: Never   Smokeless tobacco: Never  Vaping Use   Vaping status: Never Used  Substance and Sexual Activity   Alcohol use: No    Alcohol/week: 0.0 standard drinks of alcohol   Drug use: No   Sexual activity: Never  Other Topics Concern   Not on file  Social History Narrative   Not on file   Social Determinants of Health   Financial Resource Strain: Not on file  Food Insecurity: Not on file  Transportation Needs: Not on file  Physical Activity: Not on file  Stress: Not on file  Social Connections: Not on file  Intimate Partner Violence: Not on file   Social History   Tobacco Use  Smoking Status Never  Smokeless Tobacco Never   Social History   Substance and Sexual Activity  Alcohol Use No   Alcohol/week: 0.0 standard drinks of alcohol    Family History:  Family History  Problem Relation Age of Onset   Cancer Mother        colon-rectal   Emphysema Father    Cancer Sister        breast   Hypertension Sister    Diabetes Sister    Breast  cancer Sister 58   Stroke Sister    Hypertension Brother      Past medical history, surgical history, medications, allergies, family history and social history reviewed with patient today and changes made to appropriate areas of the chart.   ROS All other ROS negative except what is listed above and in the HPI.      Objective:    BP 133/69   Pulse 84   Temp 98.3 F (36.8 C) (Oral)   Ht 5' 7.2" (1.707 m)   Wt 194 lb 9.6 oz (88.3 kg)   LMP  (LMP Unknown)   SpO2 98%   BMI 30.30 kg/m   Wt Readings from Last 3 Encounters:  04/03/23 194 lb 9.6 oz (88.3 kg)  05/04/22 186 lb 4.8 oz (84.5 kg)  03/30/22 189 lb (85.7 kg)    Physical Exam Vitals and nursing note reviewed. Exam conducted with a chaperone present.  Constitutional:      General: She is awake. She is not in acute distress.    Appearance: She is well-developed and well-groomed. She is not ill-appearing or toxic-appearing.  HENT:     Head: Normocephalic and atraumatic.     Right Ear: Hearing, tympanic membrane, ear canal and external ear normal. No drainage.     Left Ear: Hearing, tympanic membrane, ear canal and external ear normal. No drainage.     Nose: Nose normal.     Right Sinus: No maxillary sinus tenderness or frontal sinus tenderness.     Left Sinus: No maxillary sinus tenderness or frontal sinus tenderness.     Mouth/Throat:     Mouth: Mucous membranes are moist.     Pharynx: Oropharynx is clear. Uvula midline. No pharyngeal swelling, oropharyngeal exudate or posterior oropharyngeal erythema.  Eyes:     General: Lids are normal.        Right eye: No discharge.        Left eye: No discharge.     Extraocular Movements: Extraocular movements intact.     Conjunctiva/sclera: Conjunctivae normal.     Pupils: Pupils are equal, round, and reactive to light.     Visual Fields: Right eye visual fields normal and left eye visual fields normal.  Neck:     Thyroid: No thyromegaly.     Vascular: No carotid bruit.     Trachea: Trachea normal.  Cardiovascular:     Rate and  Rhythm: Normal rate and regular rhythm.     Heart sounds: Normal heart sounds. No murmur heard.    No gallop.  Pulmonary:     Effort: Pulmonary effort is normal. No accessory muscle usage or respiratory distress.     Breath sounds: Normal breath sounds.  Chest:  Breasts:    Right: Normal.     Left: Normal.  Abdominal:     General: Bowel sounds are normal.     Palpations: Abdomen is soft. There is no hepatomegaly or splenomegaly.     Tenderness: There is no abdominal tenderness.  Musculoskeletal:        General: Normal range of motion.     Cervical back: Normal range of motion and neck supple.     Right lower leg: No edema.     Left lower leg: No edema.  Lymphadenopathy:     Head:     Right side of head: No submental, submandibular, tonsillar, preauricular or posterior auricular adenopathy.     Left side of head: No submental, submandibular, tonsillar, preauricular or posterior auricular adenopathy.  Cervical: No cervical adenopathy.     Upper Body:     Right upper body: No supraclavicular, axillary or pectoral adenopathy.     Left upper body: No supraclavicular, axillary or pectoral adenopathy.  Skin:    General: Skin is warm and dry.     Capillary Refill: Capillary refill takes less than 2 seconds.     Findings: No rash.  Neurological:     Mental Status: She is alert and oriented to person, place, and time.     Gait: Gait is intact.     Deep Tendon Reflexes: Reflexes are normal and symmetric.     Reflex Scores:      Brachioradialis reflexes are 2+ on the right side and 2+ on the left side.      Patellar reflexes are 2+ on the right side and 2+ on the left side. Psychiatric:        Attention and Perception: Attention normal.        Mood and Affect: Mood normal.        Speech: Speech normal.        Behavior: Behavior normal. Behavior is cooperative.        Thought Content: Thought content normal.        Judgment: Judgment normal.    Results for orders placed or  performed in visit on 03/30/22  CBC with Differential/Platelet  Result Value Ref Range   WBC 6.3 3.4 - 10.8 x10E3/uL   RBC 5.05 3.77 - 5.28 x10E6/uL   Hemoglobin 13.2 11.1 - 15.9 g/dL   Hematocrit 10.9 32.3 - 46.6 %   MCV 85 79 - 97 fL   MCH 26.1 (L) 26.6 - 33.0 pg   MCHC 30.8 (L) 31.5 - 35.7 g/dL   RDW 55.7 32.2 - 02.5 %   Platelets 405 150 - 450 x10E3/uL   Neutrophils 56 Not Estab. %   Lymphs 36 Not Estab. %   Monocytes 6 Not Estab. %   Eos 1 Not Estab. %   Basos 1 Not Estab. %   Neutrophils Absolute 3.5 1.4 - 7.0 x10E3/uL   Lymphocytes Absolute 2.3 0.7 - 3.1 x10E3/uL   Monocytes Absolute 0.4 0.1 - 0.9 x10E3/uL   EOS (ABSOLUTE) 0.1 0.0 - 0.4 x10E3/uL   Basophils Absolute 0.1 0.0 - 0.2 x10E3/uL   Immature Granulocytes 0 Not Estab. %   Immature Grans (Abs) 0.0 0.0 - 0.1 x10E3/uL  Comprehensive metabolic panel  Result Value Ref Range   Glucose 90 70 - 99 mg/dL   BUN 10 8 - 27 mg/dL   Creatinine, Ser 4.27 0.57 - 1.00 mg/dL   eGFR 99 >06 CB/JSE/8.31   BUN/Creatinine Ratio 15 12 - 28   Sodium 141 134 - 144 mmol/L   Potassium 4.4 3.5 - 5.2 mmol/L   Chloride 102 96 - 106 mmol/L   CO2 24 20 - 29 mmol/L   Calcium 9.3 8.7 - 10.3 mg/dL   Total Protein 6.7 6.0 - 8.5 g/dL   Albumin 4.3 3.9 - 4.9 g/dL   Globulin, Total 2.4 1.5 - 4.5 g/dL   Albumin/Globulin Ratio 1.8 1.2 - 2.2   Bilirubin Total <0.2 0.0 - 1.2 mg/dL   Alkaline Phosphatase 204 (H) 44 - 121 IU/L   AST 18 0 - 40 IU/L   ALT 23 0 - 32 IU/L  Lipid Panel w/o Chol/HDL Ratio  Result Value Ref Range   Cholesterol, Total 201 (H) 100 - 199 mg/dL   Triglycerides 517 (H) 0 - 149  mg/dL   HDL 40 >16 mg/dL   VLDL Cholesterol Cal 28 5 - 40 mg/dL   LDL Chol Calc (NIH) 109 (H) 0 - 99 mg/dL  TSH  Result Value Ref Range   TSH 1.240 0.450 - 4.500 uIU/mL  VITAMIN D 25 Hydroxy (Vit-D Deficiency, Fractures)  Result Value Ref Range   Vit D, 25-Hydroxy 21.8 (L) 30.0 - 100.0 ng/mL  Gamma GT  Result Value Ref Range   GGT 17 0 - 60 IU/L       Assessment & Plan:   Problem List Items Addressed This Visit       Cardiovascular and Mediastinum   Hypertension    Ongoing, stable. Has made diet changes over past 3 years with benefit.  BP below goal.  No medications at this time.  Continue to monitor and initiate as needed.  Labs today: CBC, CMP, TSH.      Relevant Orders   Comprehensive metabolic panel   CBC with Differential/Platelet   TSH     Other   Elevated alkaline phosphatase level    Ongoing, normal GGT and no RUQ symptoms.  Will continue to monitor.  Educated patient on findings.  Labs today: CMP, GGT, PTH.      Relevant Orders   Comprehensive metabolic panel   Gamma GT   PTH, intact (no Ca)   Elevated low density lipoprotein (LDL) cholesterol level - Primary    Chronic, ASCVD 5.7% and LDL <190 -- right carotid calcifications noted on dental imaging, but per patient the dentist told her no change from 2018.  Continue without medication and focus on diet and exercise.  Lipid panel today.  Discussed possible doppler ultrasound carotids or CT calcium scoring -- currently she has no symptoms and wishes to look into these.      Relevant Orders   Comprehensive metabolic panel   Lipid Panel w/o Chol/HDL Ratio   Obesity    BMI 30.30.  Recommended eating smaller high protein, low fat meals more frequently and exercising 30 mins a day 5 times a week with a goal of 10-15lb weight loss in the next 3 months. Patient voiced their understanding and motivation to adhere to these recommendations.       Vitamin D deficiency    Chronic, stable, continue supplement daily and adjust as needed.  Check level today.      Relevant Orders   VITAMIN D 25 Hydroxy (Vit-D Deficiency, Fractures)   Other Visit Diagnoses     Chronic left hip pain       Referral to ortho per request.   Relevant Orders   Ambulatory referral to Orthopedic Surgery   Flu vaccine need       Flu vaccine in office today, educated on this.   Relevant  Orders   Flu vaccine trivalent PF, 6mos and older(Flulaval,Afluria,Fluarix,Fluzone) (Completed)   Need for Tdap vaccination       Tdap provided in office today, educated on this.   Relevant Orders   Tdap vaccine greater than or equal to 7yo IM (Completed)   Encounter for screening mammogram for malignant neoplasm of breast       Mammogram ordered and instructed how to schedule.   Relevant Orders   MM 3D SCREENING MAMMOGRAM BILATERAL BREAST   Encounter for annual physical exam       Annual physical today with labs and health maintenance reviewed, discussed with patient.        Follow up plan: Return in about 1 year (around  04/02/2024) for Annual Physical.   LABORATORY TESTING:  - Pap smear: up to date  IMMUNIZATIONS:   - Tdap: Tetanus vaccination status reviewed: Td vaccination indicated and given today. - Influenza: Administered today - Pneumovax: Not applicable - Prevnar: Not applicable - COVID: Up to date - HPV: Not applicable - Shingrix vaccine: Up to date  SCREENING: -Mammogram: Ordered today  - Colonoscopy: Up to date  - Bone Density: Not applicable  -Hearing Test: Not applicable  -Spirometry: Not applicable   PATIENT COUNSELING:   Advised to take 1 mg of folate supplement per day if capable of pregnancy.   Sexuality: Discussed sexually transmitted diseases, partner selection, use of condoms, avoidance of unintended pregnancy  and contraceptive alternatives.   Advised to avoid cigarette smoking.  I discussed with the patient that most people either abstain from alcohol or drink within safe limits (<=14/week and <=4 drinks/occasion for males, <=7/weeks and <= 3 drinks/occasion for females) and that the risk for alcohol disorders and other health effects rises proportionally with the number of drinks per week and how often a drinker exceeds daily limits.  Discussed cessation/primary prevention of drug use and availability of treatment for abuse.   Diet: Encouraged  to adjust caloric intake to maintain  or achieve ideal body weight, to reduce intake of dietary saturated fat and total fat, to limit sodium intake by avoiding high sodium foods and not adding table salt, and to maintain adequate dietary potassium and calcium preferably from fresh fruits, vegetables, and low-fat dairy products.    Stressed the importance of regular exercise  Injury prevention: Discussed safety belts, safety helmets, smoke detector, smoking near bedding or upholstery.   Dental health: Discussed importance of regular tooth brushing, flossing, and dental visits.    NEXT PREVENTATIVE PHYSICAL DUE IN 1 YEAR. Return in about 1 year (around 04/02/2024) for Annual Physical.

## 2023-04-03 NOTE — Assessment & Plan Note (Signed)
BMI 30.30.  Recommended eating smaller high protein, low fat meals more frequently and exercising 30 mins a day 5 times a week with a goal of 10-15lb weight loss in the next 3 months. Patient voiced their understanding and motivation to adhere to these recommendations.

## 2023-04-03 NOTE — Assessment & Plan Note (Signed)
Ongoing, normal GGT and no RUQ symptoms.  Will continue to monitor.  Educated patient on findings.  Labs today: CMP, GGT, PTH.

## 2023-04-03 NOTE — Assessment & Plan Note (Signed)
Chronic, stable, continue supplement daily and adjust as needed.  Check level today.

## 2023-04-03 NOTE — Assessment & Plan Note (Signed)
Chronic, ASCVD 5.7% and LDL <190 -- right carotid calcifications noted on dental imaging, but per patient the dentist told her no change from 2018.  Continue without medication and focus on diet and exercise.  Lipid panel today.  Discussed possible doppler ultrasound carotids or CT calcium scoring -- currently she has no symptoms and wishes to look into these.

## 2023-04-04 LAB — COMPREHENSIVE METABOLIC PANEL
ALT: 22 [IU]/L (ref 0–32)
AST: 20 [IU]/L (ref 0–40)
Albumin: 4.6 g/dL (ref 3.9–4.9)
Alkaline Phosphatase: 193 [IU]/L — ABNORMAL HIGH (ref 44–121)
BUN/Creatinine Ratio: 15 (ref 12–28)
BUN: 11 mg/dL (ref 8–27)
Bilirubin Total: <0.2 mg/dL (ref 0.0–1.2)
CO2: 22 mmol/L (ref 20–29)
Calcium: 9.5 mg/dL (ref 8.7–10.3)
Chloride: 102 mmol/L (ref 96–106)
Creatinine, Ser: 0.75 mg/dL (ref 0.57–1.00)
Globulin, Total: 2.5 g/dL (ref 1.5–4.5)
Glucose: 95 mg/dL (ref 70–99)
Potassium: 4.4 mmol/L (ref 3.5–5.2)
Sodium: 141 mmol/L (ref 134–144)
Total Protein: 7.1 g/dL (ref 6.0–8.5)
eGFR: 89 mL/min/{1.73_m2} (ref >59)

## 2023-04-04 LAB — CBC WITH DIFFERENTIAL/PLATELET
Basophils Absolute: 0 10*3/uL (ref 0.0–0.2)
Basos: 1 %
EOS (ABSOLUTE): 0.1 10*3/uL (ref 0.0–0.4)
Eos: 1 %
Hematocrit: 43.4 % (ref 34.0–46.6)
Hemoglobin: 13.8 g/dL (ref 11.1–15.9)
Immature Grans (Abs): 0 10*3/uL (ref 0.0–0.1)
Immature Granulocytes: 0 %
Lymphocytes Absolute: 2.2 10*3/uL (ref 0.7–3.1)
Lymphs: 34 %
MCH: 27.7 pg (ref 26.6–33.0)
MCHC: 31.8 g/dL (ref 31.5–35.7)
MCV: 87 fL (ref 79–97)
Monocytes Absolute: 0.4 10*3/uL (ref 0.1–0.9)
Monocytes: 6 %
Neutrophils Absolute: 3.7 10*3/uL (ref 1.4–7.0)
Neutrophils: 58 %
Platelets: 405 10*3/uL (ref 150–450)
RBC: 4.99 x10E6/uL (ref 3.77–5.28)
RDW: 13.2 % (ref 11.7–15.4)
WBC: 6.4 10*3/uL (ref 3.4–10.8)

## 2023-04-04 LAB — LIPID PANEL W/O CHOL/HDL RATIO
Cholesterol, Total: 218 mg/dL — ABNORMAL HIGH (ref 100–199)
HDL: 44 mg/dL (ref >39)
LDL Chol Calc (NIH): 139 mg/dL — ABNORMAL HIGH (ref 0–99)
Triglycerides: 193 mg/dL — ABNORMAL HIGH (ref 0–149)
VLDL Cholesterol Cal: 35 mg/dL (ref 5–40)

## 2023-04-04 LAB — GAMMA GT: GGT: 18 [IU]/L (ref 0–60)

## 2023-04-04 LAB — TSH: TSH: 1.57 u[IU]/mL (ref 0.450–4.500)

## 2023-04-04 LAB — VITAMIN D 25 HYDROXY (VIT D DEFICIENCY, FRACTURES): Vit D, 25-Hydroxy: 31.5 ng/mL (ref 30.0–100.0)

## 2023-04-04 LAB — PARATHYROID HORMONE, INTACT (NO CA): PTH: 23 pg/mL (ref 15–65)

## 2023-04-04 NOTE — Progress Notes (Signed)
Contacted via MyChart The 10-year ASCVD risk score (Arnett DK, et al., 2019) is: 5.7%   Values used to calculate the score:     Age: 62 years     Sex: Female     Is Non-Hispanic African American: No     Diabetic: No     Tobacco smoker: No     Systolic Blood Pressure: 133 mmHg     Is BP treated: No     HDL Cholesterol: 44 mg/dL     Total Cholesterol: 218 mg/dL   Good morning Maria Ball, your labs have returned: - Kidney function, creatinine and eGFR, remains normal, as is liver function, AST and ALT.  - Lipid panel still shows elevations, but no medication needed at this time. - CBC shows no anemia or infection. - Alkaline phosphatase still a little elevated, but trended down from previous + GGT is normal.  I am waiting on PTH (parathyroid check) and will let you know if abnormal. - Remainder of labs stable.  Any questions? Keep being amazing!!  Thank you for allowing me to participate in your care.  I appreciate you. Kindest regards, Maria Ball

## 2023-04-11 ENCOUNTER — Other Ambulatory Visit (HOSPITAL_BASED_OUTPATIENT_CLINIC_OR_DEPARTMENT_OTHER): Payer: Self-pay | Admitting: Orthopaedic Surgery

## 2023-04-11 DIAGNOSIS — M25552 Pain in left hip: Secondary | ICD-10-CM

## 2023-04-12 ENCOUNTER — Telehealth (HOSPITAL_BASED_OUTPATIENT_CLINIC_OR_DEPARTMENT_OTHER): Payer: Self-pay

## 2023-04-12 NOTE — Telephone Encounter (Signed)
LVM for pt to cb, needs xrays prior to appt in [place] tomorrow

## 2023-04-13 ENCOUNTER — Ambulatory Visit: Payer: No Typology Code available for payment source

## 2023-04-14 ENCOUNTER — Ambulatory Visit
Admission: RE | Admit: 2023-04-14 | Discharge: 2023-04-14 | Disposition: A | Discharge status: Home / Self Care | Payer: No Typology Code available for payment source | Source: Ambulatory Visit | Attending: Orthopaedic Surgery

## 2023-04-14 ENCOUNTER — Ambulatory Visit
Admission: RE | Admit: 2023-04-14 | Discharge: 2023-04-14 | Disposition: A | Discharge status: Home / Self Care | Payer: No Typology Code available for payment source | Attending: Orthopaedic Surgery | Admitting: Orthopaedic Surgery

## 2023-04-14 ENCOUNTER — Other Ambulatory Visit (HOSPITAL_BASED_OUTPATIENT_CLINIC_OR_DEPARTMENT_OTHER): Payer: Self-pay | Admitting: Orthopaedic Surgery

## 2023-04-14 DIAGNOSIS — M25552 Pain in left hip: Secondary | ICD-10-CM

## 2023-04-20 ENCOUNTER — Ambulatory Visit: Payer: No Typology Code available for payment source | Admitting: Orthopedic Surgery

## 2023-04-20 DIAGNOSIS — M5432 Sciatica, left side: Secondary | ICD-10-CM

## 2023-04-20 NOTE — Progress Notes (Signed)
New Patient Visit  Assessment: Maria Ball is a 62 y.o. female with the following: Left sided sciatica  Plan: Maria Ball has pain, primarily in the left buttock, with some pain in the anterior hip.  No pain in the groin.  Pain is not recreated with internal or external rotation of the left hip.  Hyperflexion does cause some mild discomfort in the anterior aspect of her hip, but no pain in the groin specifically.  Otherwise, most the pain is in the buttock.  This is most consistent with sciatic or low back pain.  Radiographs of the left hip show some mild degenerative changes overall, with well-maintained joint space.  This was discussed with the patient.  She has been taking NSAIDs intermittently.  I recommended a short course of consistent NSAID use, for up to 1-2 weeks and then as needed.  She states her understanding.  In addition, we discussed the possibility of formal physical therapy versus home exercises.  She would like to try the medicines, as well as some home exercises.  If she continues to have issues, would recommend therapy.  No follow-up is needed at this time.  Follow-up: Return if symptoms worsen or fail to improve.  Subjective:  Chief Complaint  Patient presents with   Left Hip - Pain    History of Present Illness: Maria Ball is a 62 y.o. female who has been referred by Aura Dials, NP for evaluation of left hip pain.  She states she has had pain in the left hip for a couple of months.  She localizes the pain to the left buttock, and anterior hip.  She denies pain in her groin.  She states when the pain is bad she will occasionally have some radiating symptoms into the anterior thigh.  Pain gets worse with standing and walking.  She also has noticed that sitting for long periods of time causes some stiffness.  No prior surgeries.  No prior treatments.  She has been taking ibuprofen or Aleve occasionally.  She denies numbness and tingling.  She has  not worked with physical therapy.   Review of Systems: No fevers or chills No numbness or tingling No chest pain No shortness of breath No bowel or bladder dysfunction No GI distress No headaches   Medical History:  Past Medical History:  Diagnosis Date   Alkaline phosphatase elevation    Basal cell carcinoma 02/17/2021   L of midline sup forehead - ED&C   Depression    Depression 12/11/2014   DVT (deep venous thrombosis) (HCC)    while taking OCP in the 1980's   Hypertension    Shingles    Shingles     Past Surgical History:  Procedure Laterality Date   COLONOSCOPY WITH PROPOFOL N/A 04/16/2018   Procedure: COLONOSCOPY WITH PROPOFOL;  Surgeon: Wyline Mood, MD;  Location: [facility] ENDOSCOPY;  Service: Gastroenterology;  Laterality: N/A;   COLONOSCOPY WITH PROPOFOL N/A 05/24/2021   Procedure: COLONOSCOPY WITH PROPOFOL;  Surgeon: Toney Reil, MD;  Location: [facility] ENDOSCOPY;  Service: Gastroenterology;  Laterality: N/A;   DILATION AND CURETTAGE OF UTERUS     TONSILLECTOMY      Family History  Problem Relation Age of Onset   Cancer Mother        colon-rectal   Emphysema Father    Cancer Sister        breast   Hypertension Sister    Diabetes Sister    Breast cancer Sister 58   Stroke  Sister    Hypertension Brother    Social History   Tobacco Use   Smoking status: Never   Smokeless tobacco: Never  Vaping Use   Vaping status: Never Used  Substance Use Topics   Alcohol use: No    Alcohol/week: 0.0 standard drinks of alcohol   Drug use: No    No Known Allergies  No outpatient medications have been marked as taking for the 04/20/23 encounter (Office Visit) with [facility] PROVIDER.    Objective: LMP  (LMP Unknown)   Physical Exam:  General: Alert and oriented. and No acute distress. Gait: Normal gait.  Evaluation of the left hip demonstrates no deformity.  No tenderness in the lower back.  Negative straight leg raise.  No pain in the groin with  internal or external rotation.  Mild tenderness to palpation over the lateral hip.  Flexion of the hip beyond 90 degrees causes some pain over the anterior hip.  No pain in the impingement position.  Excellent strength in bilateral lower extremities.  2+ patellar tendon reflexes.   IMAGING: I personally ordered and reviewed the following images   AP pelvis and left hip x-rays were obtained.  No acute injuries.  Well-maintained joint space.  Mild degenerative changes overall.  No subchondral cysts.  No dislocation.   New Medications:  No orders of the defined types were placed in this encounter.     Oliver Barre, MD  04/20/2023 11:39 AM

## 2023-04-20 NOTE — Patient Instructions (Signed)
We have discussed taking over the counter medications for your pain.  Below are some common medicines to consider.  Also listed are the recommended doses and how to take these medications as a prescription strength medicine.  If you experience any issues or side effects, please discontinue the medicine and contact the office for some assistance.   Tylenol or acetaminophen - can be used to help treat minor pains and fevers or chills.  Common doses include 350 mg, 500 mg and 650 mg.  Following surgery, I recommend taking 1000 mg of this medication three times a day.  Some narcotic pain medications contain acetaminophen, so you cannot take additional acetaminophen. This medicine can be bad for your liver.  DO NOT TAKE MORE THAN 4000 mg per day.  Advil, Motrin or Ibuprofen - anti-inflammatory medicines.  Can be used to treat chronic pain and help with swelling.  Common over the counter dose includes 200 mg.  Bottle states you can take 1-2 pills.  Common prescription doses include 600 mg or 800 mg pills.  This medication can be hard on your stomach and your kidneys.  If you have a history of stomach ulcers or kidney issues, you should discuss this with your PCP.  This medicine can be taken 3 times per day.  You should take this medication with food in your stomach.   Aleve or Naproxen - anti inflammatory medicines.  Can be used to treat chronic pain and help with swelling. Common over the counter dose is 220 mg.  The bottle states you can take 1 pill, twice a day.   Typical prescription dose is 500 mg two times a day.  This medication can be hard on your stomach and your kidneys.  If you have a history of stomach ulcers or kidney issues, you should discuss this with your PCP.  This medicine can be taken 2 times per day.  You should take this medication with food in your stomach.    Acetaminopen and ibuprofen/naproxen can be taking at the same time, but please do not take ibuprofen and naproxen at the same time.   These medications work the same way and can cause further injury to your stomach and/or kidneys.    Patients taking a blood thinner are often encouraged not to take NSAIDs or medications like ibuprofen or naproxen because they increase the possibility of internal bleeding.  If you have any questions, you should contact the doctor who recommended these medicines.     Sciatica Rehab  Ask your health care provider which exercises are safe for you. Do exercises exactly as told by your health care provider and adjust them as directed. It is normal to feel mild stretching, pulling, tightness, or discomfort as you do these exercises. Stop right away if you feel sudden pain or your pain gets worse. Do not begin these exercises until told by your health care provider.   Stretching and range-of-motion exercises These exercises warm up your muscles and joints and improve the movement and flexibility of your hips and back. These exercises also help to relieve pain, numbness, and tingling. Sciatic nerve glide Sit in a chair with your head facing down toward your chest. Place your hands behind your back. Let your shoulders slump forward. Slowly straighten one of your legs while you tilt your head back as if you are looking toward the ceiling. Only straighten your leg as far as you can without making your symptoms worse. Hold this position for 10 seconds. Slowly  return to the starting position. Repeat with your other leg. Repeat 10 times. Complete this exercise 1-2 times a day. Knee to chest with hip adduction and internal rotation  Lie on your back on a firm surface with both legs straight. Bend one of your knees and move it up toward your chest until you feel a gentle stretch in your lower back and buttock. Then, move your knee toward the shoulder that is on the opposite side from your leg. This is hip adduction and internal rotation. Hold your leg in this position by holding on to the front of your  knee. Hold this position for 10 seconds. Slowly return to the starting position. Repeat with your other leg. Repeat 10 times. Complete this exercise 1-2 times a day. Prone extension on elbows  Lie on your abdomen on a firm surface. A bed may be too soft for this exercise. Prop yourself up on your elbows. Use your arms to help lift your chest up until you feel a gentle stretch in your abdomen and your lower back. This will place some of your body weight on your elbows. If this is uncomfortable, try stacking pillows under your chest. Your hips should stay down, against the surface that you are lying on. Keep your hip and back muscles relaxed. Hold this position for 10 seconds. Slowly relax your upper body and return to the starting position. Repeat 10 times. Complete this exercise 1-2 times a day. Strengthening exercises These exercises build strength and endurance in your back. Endurance is the ability to use your muscles for a long time, even after they get tired. Pelvic tilt This exercise strengthens the muscles that lie deep in the abdomen. Lie on your back on a firm surface. Bend your knees and keep your feet flat on the floor. Tense your abdominal muscles. Tip your pelvis up toward the ceiling and flatten your lower back into the floor. To help with this exercise, you may place a small towel under your lower back and try to push your back into the towel. Hold this position for 10 seconds. Let your muscles relax completely before you repeat this exercise. Repeat 10 times. Complete this exercise 1-2 times a day. Alternating arm and leg raises  Get on your hands and knees on a firm surface. If you are on a hard floor, you may want to use padding, such as an exercise mat, to cushion your knees. Line up your arms and legs. Your hands should be directly below your shoulders, and your knees should be directly below your hips. Lift your left leg behind you. At the same time, raise your right  arm and straighten it in front of you. Do not lift your leg higher than your hip. Do not lift your arm higher than your shoulder. Keep your abdominal and back muscles tight. Keep your hips facing the ground. Do not arch your back. Keep your balance carefully, and do not hold your breath. Hold this position for 10 seconds. Slowly return to the starting position. Repeat with your right leg and your left arm. Repeat 10 times. Complete this exercise 1-2 times a day. Posture and body mechanics Good posture and healthy body mechanics can help to relieve stress in your body's tissues and joints. Body mechanics refers to the movements and positions of your body while you do your daily activities. Posture is part of body mechanics. Good posture means: Your spine is in its natural S-curve position (neutral). Your shoulders are pulled back  slightly. Your head is not tipped forward. Follow these guidelines to improve your posture and body mechanics in your everyday activities. Standing  When standing, keep your spine neutral and your feet about hip width apart. Keep a slight bend in your knees. Your ears, shoulders, and hips should line up. When you do a task in which you stand in one place for a long time, place one foot up on a stable object that is 2-4 inches (5-10 cm) high, such as a footstool. This helps keep your spine neutral. Sitting  When sitting, keep your spine neutral and keep your feet flat on the floor. Use a footrest, if necessary, and keep your thighs parallel to the floor. Avoid rounding your shoulders, and avoid tilting your head forward. When working at a desk or a computer, keep your desk at a height where your hands are slightly lower than your elbows. Slide your chair under your desk so you are close enough to maintain good posture. When working at a computer, place your monitor at a height where you are looking straight ahead and you do not have to tilt your head forward or  downward to look at the screen. Resting When lying down and resting, avoid positions that are most painful for you. If you have pain with activities such as sitting, bending, stooping, or squatting, lie in a position in which your body does not bend very much. For example, avoid curling up on your side with your arms and knees near your chest (fetal position). If you have pain with activities such as standing for a long time or reaching with your arms, lie with your spine in a neutral position and bend your knees slightly. Try the following positions: Lying on your side with a pillow between your knees. Lying on your back with a pillow under your knees. Lifting  When lifting objects, keep your feet at least shoulder width apart and tighten your abdominal muscles. Bend your knees and hips and keep your spine neutral. It is important to lift using the strength of your legs, not your back. Do not lock your knees straight out. Always ask for help to lift heavy or awkward objects. This information is not intended to replace advice given to you by your health care provider. Make sure you discuss any questions you have with your health care provider. Document Revised: 08/17/2018 Document Reviewed: 05/17/2018 Elsevier Patient Education  2022 [employer].

## 2023-05-04 ENCOUNTER — Ambulatory Visit
Admission: RE | Admit: 2023-05-04 | Discharge: 2023-05-04 | Disposition: A | Discharge status: Home / Self Care | Payer: No Typology Code available for payment source | Source: Ambulatory Visit | Attending: Nurse Practitioner | Admitting: Nurse Practitioner

## 2023-05-04 DIAGNOSIS — Z1231 Encounter for screening mammogram for malignant neoplasm of breast: Secondary | ICD-10-CM | POA: Diagnosis present

## 2023-05-09 NOTE — Progress Notes (Signed)
Contacted via MyChart   Normal mammogram, may repeat in one year:)

## 2023-07-25 ENCOUNTER — Telehealth: Payer: Self-pay | Admitting: Orthopedic Surgery

## 2023-07-25 DIAGNOSIS — M5432 Sciatica, left side: Secondary | ICD-10-CM

## 2023-07-25 DIAGNOSIS — M25552 Pain in left hip: Secondary | ICD-10-CM

## 2023-07-25 NOTE — Telephone Encounter (Signed)
 Patient called. Says her pain isn't better.

## 2023-07-27 NOTE — Telephone Encounter (Signed)
 Spoke w/ pt who would like to go to [facility]. Order placed and faxed.

## 2023-07-27 NOTE — Telephone Encounter (Signed)
 Called and LVM for a return call. Want to ensure therapy order goes to the right facility before I order.

## 2023-07-27 NOTE — Telephone Encounter (Signed)
 DR. Dallas Schimke   PT schedule she is returning a call

## 2023-08-03 ENCOUNTER — Telehealth: Payer: Self-pay | Admitting: Orthopedic Surgery

## 2023-08-03 NOTE — Telephone Encounter (Signed)
 Pt states she seen the physical therapist today and they would like for Dr Dallas Schimke to order an mri. Pt request a call back

## 2023-08-14 ENCOUNTER — Telehealth: Payer: Self-pay | Admitting: Radiology

## 2023-08-14 NOTE — Telephone Encounter (Signed)
 Patient left voicemail requesting return call. She states that she saw Dr. Dallas Schimke, who ordered PT, but also told patient therapy would likely recommend a MRI.  She had an evaluation with PT and states that they feel the same as Dr. Dallas Schimke and an order for MRI should be submitted. She was calling to be sure we were carrying though with that process.    No order for MRI has been entered. Can you please advise?  CB for patient is [phone].  I did leave voicemail for patient advising no order has been entered yet, however, that I would send message over to see if Dr. Dallas Schimke will submit.

## 2023-08-17 ENCOUNTER — Other Ambulatory Visit: Payer: Self-pay

## 2023-08-17 DIAGNOSIS — M5432 Sciatica, left side: Secondary | ICD-10-CM

## 2023-08-17 DIAGNOSIS — M25552 Pain in left hip: Secondary | ICD-10-CM

## 2023-08-17 NOTE — Telephone Encounter (Signed)
 Called and left VM with information from provider to see what steps she wants to take before MRI is ordered.

## 2023-08-18 ENCOUNTER — Ambulatory Visit
Admission: RE | Admit: 2023-08-18 | Discharge: 2023-08-18 | Disposition: A | Discharge status: Home / Self Care | Source: Ambulatory Visit | Attending: Orthopedic Surgery | Admitting: Orthopedic Surgery

## 2023-08-18 ENCOUNTER — Ambulatory Visit
Admission: RE | Admit: 2023-08-18 | Discharge: 2023-08-18 | Disposition: A | Discharge status: Home / Self Care | Attending: Orthopedic Surgery | Admitting: Orthopedic Surgery

## 2023-08-18 DIAGNOSIS — M25552 Pain in left hip: Secondary | ICD-10-CM | POA: Insufficient documentation

## 2023-08-18 DIAGNOSIS — M5432 Sciatica, left side: Secondary | ICD-10-CM | POA: Insufficient documentation

## 2023-08-24 ENCOUNTER — Ambulatory Visit: Payer: No Typology Code available for payment source | Admitting: Dermatology

## 2023-08-24 ENCOUNTER — Encounter: Payer: Self-pay | Admitting: Dermatology

## 2023-08-24 DIAGNOSIS — L578 Other skin changes due to chronic exposure to nonionizing radiation: Secondary | ICD-10-CM | POA: Diagnosis not present

## 2023-08-24 DIAGNOSIS — D1801 Hemangioma of skin and subcutaneous tissue: Secondary | ICD-10-CM

## 2023-08-24 DIAGNOSIS — W908XXA Exposure to other nonionizing radiation, initial encounter: Secondary | ICD-10-CM

## 2023-08-24 DIAGNOSIS — L918 Other hypertrophic disorders of the skin: Secondary | ICD-10-CM

## 2023-08-24 DIAGNOSIS — L814 Other melanin hyperpigmentation: Secondary | ICD-10-CM

## 2023-08-24 DIAGNOSIS — Z85828 Personal history of other malignant neoplasm of skin: Secondary | ICD-10-CM

## 2023-08-24 DIAGNOSIS — Z1283 Encounter for screening for malignant neoplasm of skin: Secondary | ICD-10-CM | POA: Diagnosis not present

## 2023-08-24 DIAGNOSIS — L720 Epidermal cyst: Secondary | ICD-10-CM

## 2023-08-24 DIAGNOSIS — D229 Melanocytic nevi, unspecified: Secondary | ICD-10-CM

## 2023-08-24 DIAGNOSIS — L729 Follicular cyst of the skin and subcutaneous tissue, unspecified: Secondary | ICD-10-CM

## 2023-08-24 DIAGNOSIS — Z7189 Other specified counseling: Secondary | ICD-10-CM

## 2023-08-24 DIAGNOSIS — L820 Inflamed seborrheic keratosis: Secondary | ICD-10-CM

## 2023-08-24 DIAGNOSIS — Z79899 Other long term (current) drug therapy: Secondary | ICD-10-CM

## 2023-08-24 DIAGNOSIS — L821 Other seborrheic keratosis: Secondary | ICD-10-CM

## 2023-08-24 NOTE — Progress Notes (Signed)
 Follow-Up Visit   Subjective  Maria Ball is a 63 y.o. female who presents for the following: Skin Cancer Screening and Full Body Skin Exam Spot at chin she would like checked Hx of bcc   The patient presents for Total-Body Skin Exam (TBSE) for skin cancer screening and mole check. The patient has spots, moles and lesions to be evaluated, some may be new or changing and the patient may have concern these could be cancer.  The following portions of the chart were reviewed this encounter and updated as appropriate: medications, allergies, medical history  Review of Systems:  No other skin or systemic complaints except as noted in HPI or Assessment and Plan.  Objective  Well appearing patient in no apparent distress; mood and affect are within normal limits.  A full examination was performed including scalp, head, eyes, ears, nose, lips, neck, chest, axillae, abdomen, back, buttocks, bilateral upper extremities, bilateral lower extremities, hands, feet, fingers, toes, fingernails, and toenails. All findings within normal limits unless otherwise noted below.   Relevant physical exam findings are noted in the Assessment and Plan.  right thigh x 1 Erythematous stuck-on, waxy papule or plaque  Assessment & Plan   SKIN CANCER SCREENING PERFORMED TODAY.  ACTINIC DAMAGE - Chronic condition, secondary to cumulative UV/sun exposure - diffuse scaly erythematous macules with underlying dyspigmentation - Recommend daily broad spectrum sunscreen SPF 30+ to sun-exposed areas, reapply every 2 hours as needed.  - Staying in the shade or wearing long sleeves, sun glasses (UVA+UVB protection) and wide brim hats (4-inch brim around the entire circumference of the hat) are also recommended for sun protection.  - Call for new or changing lesions.  LENTIGINES, SEBORRHEIC KERATOSES, HEMANGIOMAS - Benign normal skin lesions - Benign-appearing - Call for any changes  MELANOCYTIC NEVI -  Tan-brown and/or pink-flesh-colored symmetric macules and papules - Benign appearing on exam today - Observation - Call clinic for new or changing moles - Recommend daily use of broad spectrum spf 30+ sunscreen to sun-exposed areas.   Acrochordons (Skin Tags) - Fleshy, skin-colored pedunculated papules x 1 right axilla  - Benign appearing.  - Observe. - If desired, they can be removed with an in office procedure that is not covered by insurance. - Please call the clinic if you notice any new or changing lesions.  MILIA / CYST at Left chin Exam: 0.2 cm tiny erythematous firm white papule Discussed this is a type of cyst. Benign-appearing. Sometimes these will clear with OTC adapalene/Differin 0.1% cream QHS or retinol.  Discussed extraction if symptomatic. Patient given Rx Aklief sample use pea sized amount to affected area nightly  Treatment Plan: Patient given Aklief sample use pea sized amount to affected area nightly    HISTORY OF BASAL CELL CARCINOMA OF THE SKIN - 10/12/ 2022left of midline superior forehead ED&C 02/17/2021 - No evidence of recurrence today - Recommend regular full body skin exams - Recommend daily broad spectrum sunscreen SPF 30+ to sun-exposed areas, reapply every 2 hours as needed.  - Call if any new or changing lesions are noted between office visits  INFLAMED SEBORRHEIC KERATOSIS right thigh x 1 Symptomatic, irritating, patient would like treated. Destruction of lesion - right thigh x 1 Complexity: simple   Destruction method: cryotherapy   Informed consent: discussed and consent obtained   Timeout:  patient name, date of birth, surgical site, and procedure verified Lesion destroyed using liquid nitrogen: Yes   Region frozen until ice ball extended beyond lesion: Yes  Outcome: patient tolerated procedure well with no complications   Post-procedure details: wound care instructions given   Return in about 1 year (around 08/23/2024) for TBSE.  IRandee Busing, CMA, am acting as scribe for Celine Collard, MD.   Documentation: I have reviewed the above documentation for accuracy and completeness, and I agree with the above.  Celine Collard, MD

## 2023-08-24 NOTE — Patient Instructions (Addendum)

## 2023-08-28 ENCOUNTER — Telehealth: Payer: Self-pay | Admitting: Orthopedic Surgery

## 2023-08-28 DIAGNOSIS — M5432 Sciatica, left side: Secondary | ICD-10-CM

## 2023-08-28 NOTE — Telephone Encounter (Signed)
 Dr. Ernesta Heading pt - pt lvm stating that she had an x-ray on 08/18/23 and wanted to know what the next step is.  I don't see that she was seen here, just that an order was placed for the x-ray on 08/17/23.  [phone]

## 2023-08-30 NOTE — Telephone Encounter (Signed)
 Spoke w/ pt and MRI ordered. Let her know what steps will be taken and that if all goes smoothly she will get a call to schedule.

## 2023-08-30 NOTE — Addendum Note (Signed)
 Addended by: Marti Slates on: 08/30/2023 01:54 PM   Modules accepted: Orders

## 2023-09-04 ENCOUNTER — Telehealth: Payer: Self-pay | Admitting: Orthopedic Surgery

## 2023-09-04 NOTE — Telephone Encounter (Signed)
 LVM for the patient relaying Amy's response.

## 2023-09-04 NOTE — Telephone Encounter (Signed)
 Dr. Ernesta Heading pt - spoke w/the pt, she is scheduled for a MRI on 4/29.  She stated that the MRI is for the spine, she wants to know if this is going to include the lt hip area.  [phone]

## 2023-09-05 ENCOUNTER — Ambulatory Visit

## 2023-10-04 ENCOUNTER — Ambulatory Visit: Admitting: Orthopedic Surgery

## 2023-10-04 VITALS — BP 178/91 | HR 67 | Ht 66.0 in | Wt 197.0 lb

## 2023-10-04 DIAGNOSIS — M25552 Pain in left hip: Secondary | ICD-10-CM | POA: Diagnosis not present

## 2023-10-04 DIAGNOSIS — M5432 Sciatica, left side: Secondary | ICD-10-CM | POA: Diagnosis not present

## 2023-10-04 MED ORDER — TRAMADOL HCL 50 MG PO TABS: 50.0000 mg | ORAL_TABLET | Freq: Two times a day (BID) | ORAL | 0 refills | Status: AC | PRN

## 2023-10-04 MED ORDER — TRIAMCINOLONE ACETONIDE 40 MG/ML IJ SUSP: 40.0000 mg | Freq: Once | INTRAMUSCULAR | Status: AC

## 2023-10-04 NOTE — Patient Instructions (Signed)

## 2023-10-06 ENCOUNTER — Encounter: Payer: Self-pay | Admitting: Orthopedic Surgery

## 2023-10-06 NOTE — Progress Notes (Addendum)
 Return patient Visit  Assessment: Maria Ball is a 63 y.o. female with the following: Left hip pain  Plan: Maria Ball continues to have pain in the left hip area.  Pain got worse after I saw her last.  Physical therapy was reluctant to proceed based on the level of discomfort.  She is having more pain in the anterior aspect of the left hip.  Reviewed radiographs of the left hip in clinic today, which does demonstrate some degenerative changes.  Mild to moderate overall.  We discussed the possibility of proceeding with a steroid injection, and she elected to proceed.  This was completed with the assistance of ultrasound guidance.  She would also like to try some tramadol.  Depending on the efficacy, we may have to consider obtaining an MRI.  Procedure note injection - Left hip, ultrasound guidance   Verbal consent was obtained to inject the Left hip joint  Timeout was completed to confirm the site of injection.   Using the ultrasound, the femoral neck was identified.  The joint space was also identified. The skin was prepped with alcohol and ethyl chloride was sprayed at the injection site.  A 21-gauge needle was used to inject 40 mg of triamcinolone and 1% lidocaine  (4 cc) into the hip joint of the Left hip using a direct anterior approach.  The needle was visualized entering the hip joint, and the medication was also visualized. There were no complications.  A sterile bandage was applied.   Note: In order to accurately identify the placement of the needle, ultrasound was required, to increase the accuracy, and specificity of the injection.   Follow-up: Return if symptoms worsen or fail to improve.  Subjective:  Chief Complaint  Patient presents with   Hip Pain    Follow up with the left hip  she is having pain in the left groin and left buttock area     History of Present Illness: Maria Ball is a 63 y.o. female who returns to clinic today for repeat  evaluation of left hip pain.  I saw her several months ago.  At that time, I felt as though the pain was primarily coming from her back, with a component of sciatica.  After I saw her, her pain worsened.  She was evaluated by physical therapy, but they were reluctant to continue, given the level of discomfort.  Her pain does continue in the left buttock, but she is having more pain in the anterior hip, as well as the left groin.  Pain gets worse at night.  Review of Systems: No fevers or chills No numbness or tingling No chest pain No shortness of breath No bowel or bladder dysfunction No GI distress No headaches      Objective: BP (!) 178/91   Pulse 67   Ht 5\' 6"  (1.676 m)   Wt 197 lb (89.4 kg)   LMP  (LMP Unknown)   BMI 31.80 kg/m   Physical Exam:  General: Alert and oriented. and No acute distress. Gait: Normal gait.  Evaluation of the left hip demonstrates no deformity.  No tenderness in the lower back.  Negative straight leg raise.  Mild pain in the groin with internal or external rotation.  Mild tenderness to palpation over the lateral hip.  Flexion of the hip beyond 90 degrees causes some pain over the anterior hip.  No pain in the impingement position.  Excellent strength in bilateral lower extremities.  2+ patellar tendon reflexes.  Tenderness within the left buttock.   IMAGING: I personally ordered and reviewed the following images   AP pelvis and left hip x-rays were obtained.  No acute injuries.  Well-maintained joint space.  Mild degenerative changes overall.  No subchondral cysts.  No dislocation.   New Medications:  Meds ordered this encounter  Medications   traMADol (ULTRAM) 50 MG tablet    Sig: Take 1 tablet (50 mg total) by mouth every 12 (twelve) hours as needed.    Dispense:  30 tablet    Refill:  0   triamcinolone acetonide (KENALOG-40) injection 40 mg      Tawyna Pellot A Cameren Earnest, MD  10/06/2023 8:26 AM

## 2023-10-17 ENCOUNTER — Telehealth: Payer: Self-pay | Admitting: Orthopedic Surgery

## 2023-10-17 DIAGNOSIS — M25552 Pain in left hip: Secondary | ICD-10-CM

## 2023-10-17 NOTE — Telephone Encounter (Signed)
 Dr. Ernesta Heading pt ([place]) - pt lvm stating Dr. Tita Form was going to schedule her for a MRI.  Her AVS says to return as needed.  She's wanting the MRI.  [phone]

## 2023-10-18 NOTE — Telephone Encounter (Signed)
 Spoke w/ pt who states she received an injection 5/28 and she was pain free for 2 days. Pt state she is still feeling somewhat better but pain is coming back. Would like to go ahead and move forward with getting MRI at this time.

## 2023-10-24 ENCOUNTER — Encounter: Payer: Self-pay | Admitting: Orthopedic Surgery

## 2023-11-13 ENCOUNTER — Other Ambulatory Visit

## 2023-11-21 ENCOUNTER — Inpatient Hospital Stay
Admission: RE | Admit: 2023-11-21 | Discharge: 2023-11-21 | Disposition: A | Discharge status: Home / Self Care | Source: Ambulatory Visit | Attending: Orthopedic Surgery

## 2023-11-21 DIAGNOSIS — M25552 Pain in left hip: Secondary | ICD-10-CM

## 2023-12-13 ENCOUNTER — Ambulatory Visit (INDEPENDENT_AMBULATORY_CARE_PROVIDER_SITE_OTHER): Admitting: Orthopedic Surgery

## 2023-12-13 DIAGNOSIS — M5432 Sciatica, left side: Secondary | ICD-10-CM | POA: Diagnosis not present

## 2023-12-13 DIAGNOSIS — M25552 Pain in left hip: Secondary | ICD-10-CM

## 2023-12-14 ENCOUNTER — Encounter: Payer: Self-pay | Admitting: Orthopedic Surgery

## 2023-12-14 IMAGING — MG MM DIGITAL SCREENING BILAT W/ TOMO AND CAD
8 series · 8 of 24 positions shown · non-contrast
Comparison: Previous exam(s).

CLINICAL DATA: Screening.

EXAM:
DIGITAL SCREENING BILATERAL MAMMOGRAM WITH TOMOSYNTHESIS AND CAD
TECHNIQUE: Bilateral screening digital craniocaudal and mediolateral oblique
mammograms were obtained. Bilateral screening digital breast
tomosynthesis was performed. The images were evaluated with
computer-aided detection.

[R CC synth-2D]
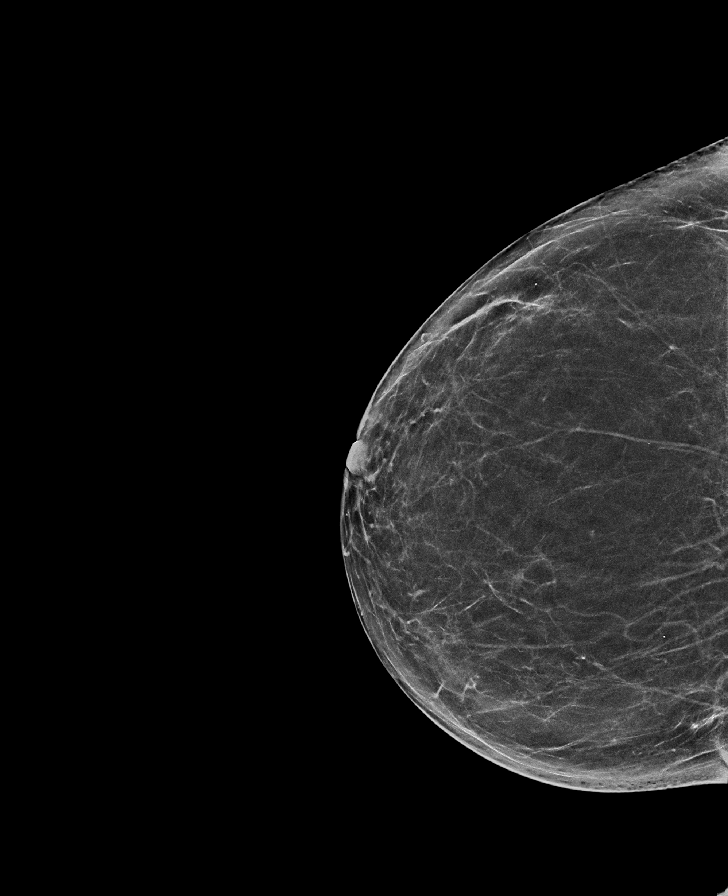

[R MLO synth-2D]
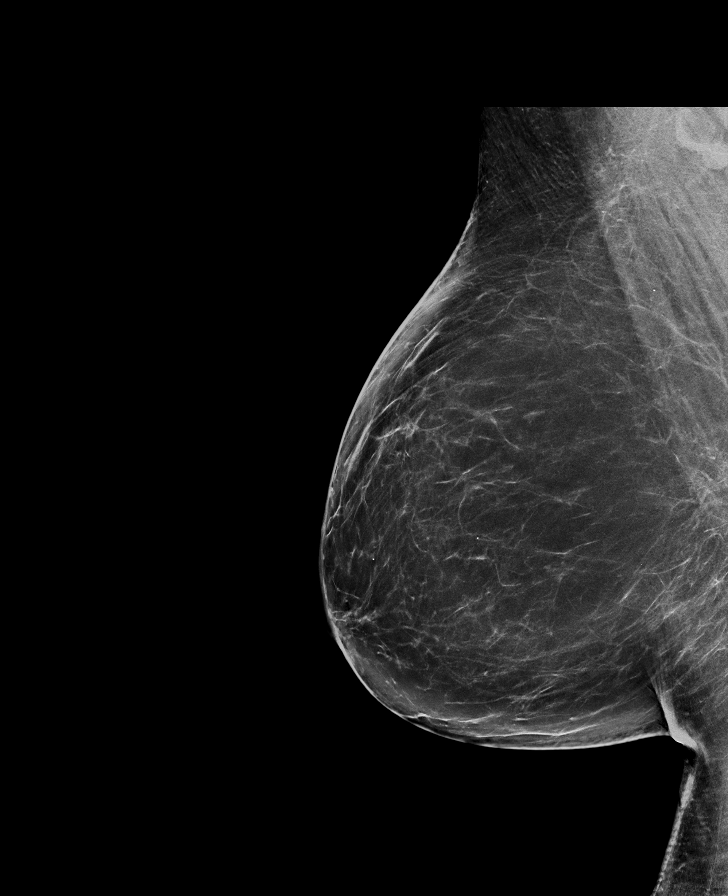

[L CC synth-2D]
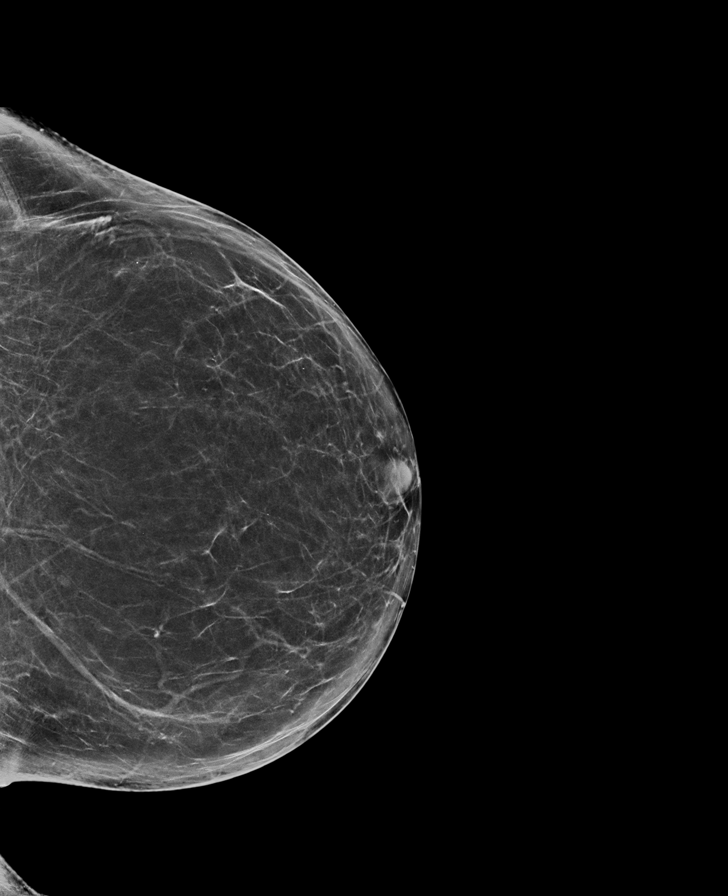

[L MLO synth-2D]
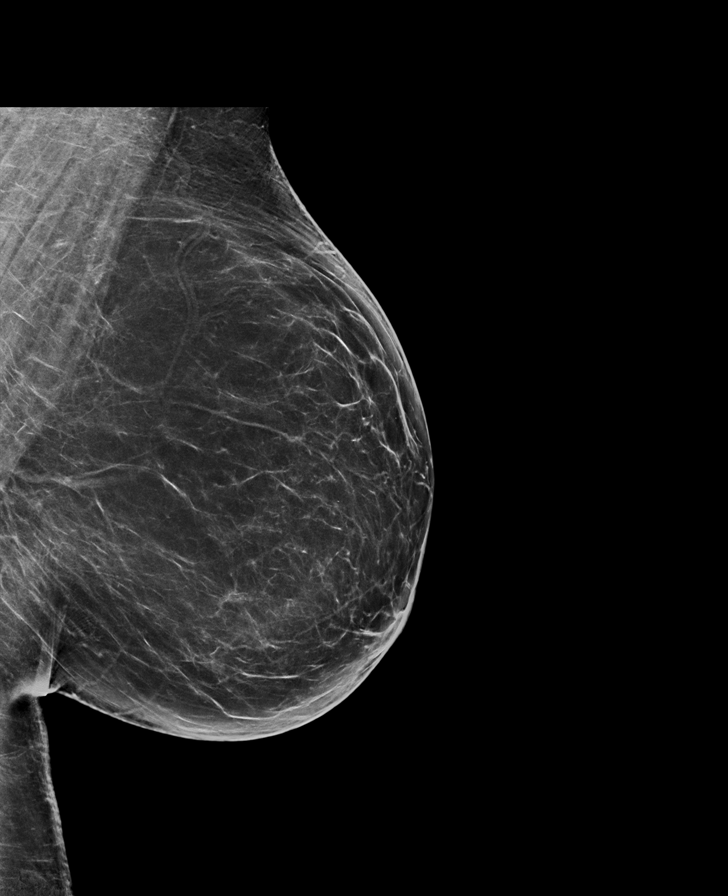

[R MLO tomo · tomo slice 45/89.0]
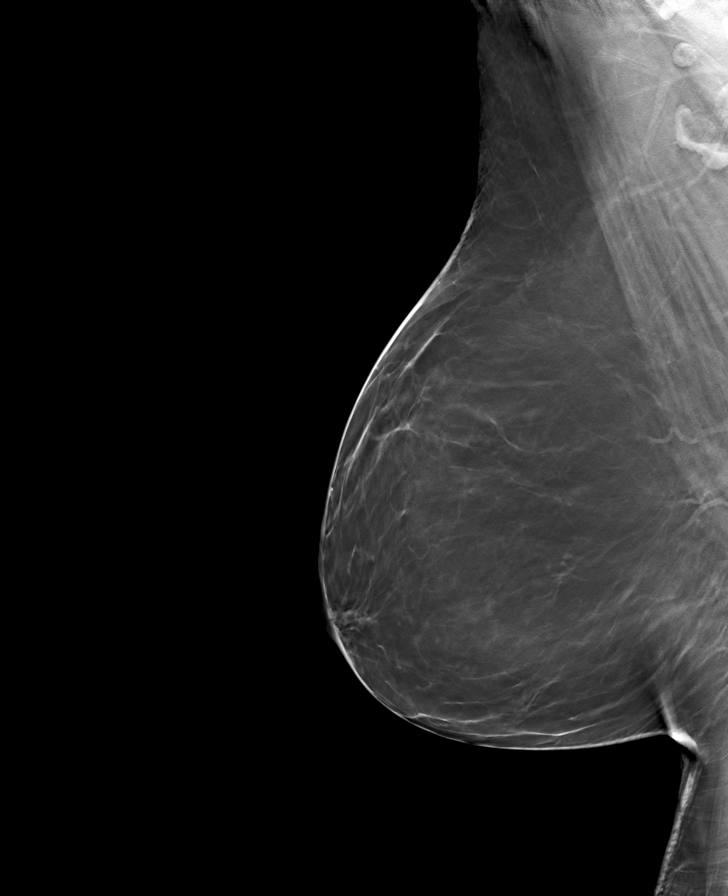

[L CC tomo · tomo slice 37/73.0]
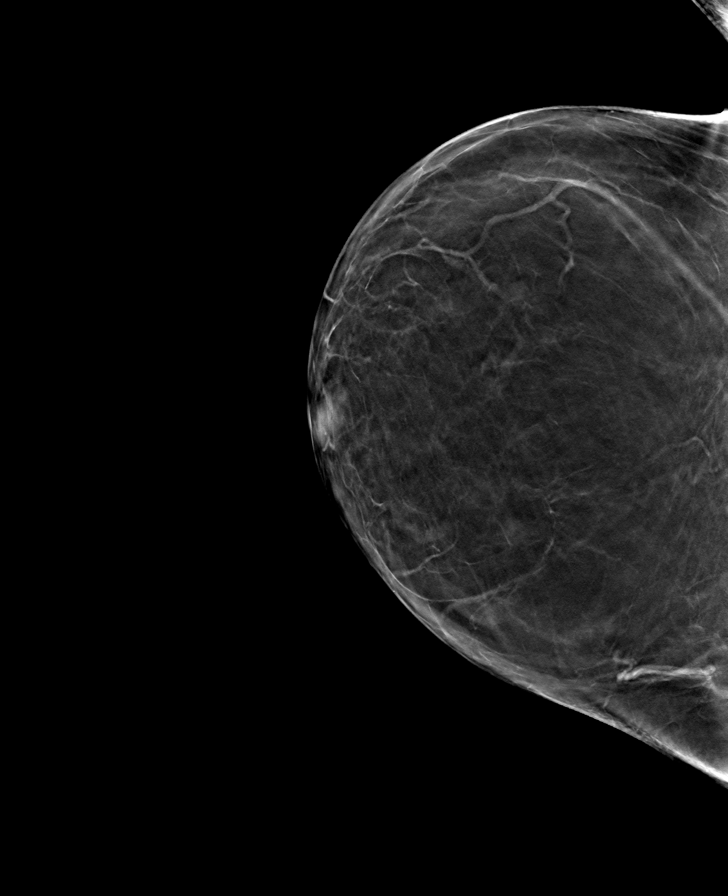

[L MLO tomo · tomo slice 43/85.0]
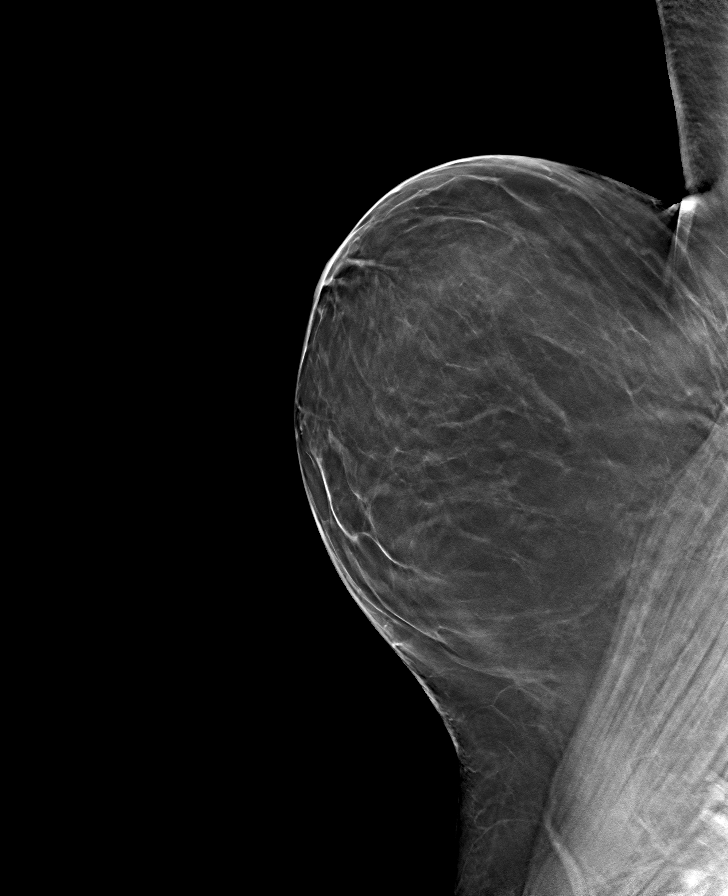

[R CC tomo · tomo slice 33/65.0]
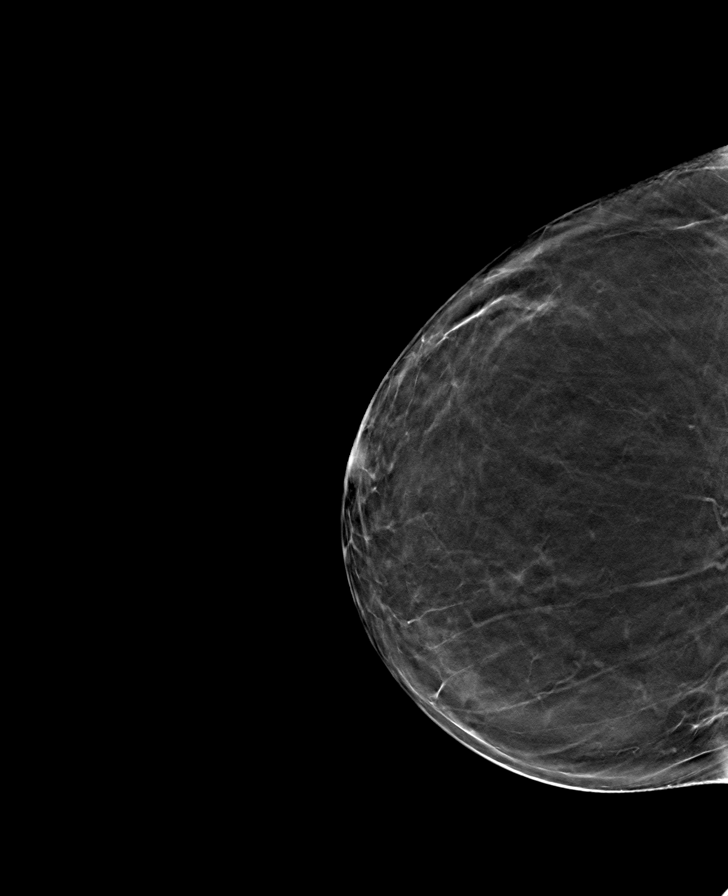

[8 of 24 positions shown; findings below may reference images not displayed]

ACR Breast Density Category b: There are scattered areas of
fibroglandular density.
FINDINGS: There are no findings suspicious for malignancy.
IMPRESSION: No mammographic evidence of malignancy. A result letter of this
screening mammogram will be mailed directly to the patient.

RECOMMENDATION:
Screening mammogram in one year. (Code:51-O-LD2)

BI-RADS CATEGORY  1: Negative.

## 2023-12-14 NOTE — Progress Notes (Signed)
 Return patient Visit  Assessment: Maria Ball is a 63 y.o. female with the following: Left hip pain Pain, consistent with sciatica  Plan: Maria Ball continues to have pain in the left hip area.  She states that she had complete relief of her symptoms in the anterior hip for 2-3 days following an intra-articular hip injection.  MRI that was recently obtained demonstrates moderate degenerative changes, with a large cyst within the femoral head, as well as a cyst within the posterior acetabulum.  Ultimately, her symptoms may be related to arthritis in the hip, but her pain is currently in the left buttock.  She notes some tenderness and spasm when the pain starts to bother her.  I had an extensive discussion with her in clinic today.  She is not interested or ready to consider a total hip arthroplasty.  At this point, I am not convinced that all of her pain is a result of the arthritis noted on MRI.  Because she is having so much pain in her buttock, I would like for her to be evaluated by Dr. Burnetta, with consideration for hydrodissection of the sciatic nerve.  Depending on the efficacy of this injection, we may revisit the ongoing discomfort she is having in the left hip.  She states understanding.  She is willing to see Dr. Burnetta.  I am happy to see her back in clinic following her evaluation of Dr. Burnetta.   Follow-up: Return for Referral to Dr. Burnetta.  Subjective:  Chief Complaint  Patient presents with   Hip Pain    MRI review left hip    History of Present Illness: Maria Ball is a 63 y.o. female who returns to clinic today for repeat evaluation of left hip pain.  When I last saw her in clinic, we completed an ultrasound-guided injection of the left hip joint.  She states that she felt a lot better for a couple days.  She no longer has pain in her anterior hip.  At this point, all of her pain is in the left buttock.  She has no pain when seated.  However, when  she goes to stand, she has a lot of pain in the buttock.  She describes massaging the area, noting some tenderness, as well as muscle spasm in the left buttock.  Massage does help.  She has obtained an MRI, and is here to discuss the findings  Review of Systems: No fevers or chills No numbness or tingling No chest pain No shortness of breath No bowel or bladder dysfunction No GI distress No headaches      Objective: LMP  (LMP Unknown)   Physical Exam:  General: Alert and oriented. and No acute distress. Gait: Normal gait.  Evaluation of the left hip demonstrates no deformity.  No tenderness in the lower back.  Negative straight leg raise.  Mild pain in the groin with internal or external rotation.  Mild tenderness to palpation over the lateral hip.  No pain in the impingement position.  Excellent strength in bilateral lower extremities.  2+ patellar tendon reflexes.  Tenderness within the left buttock.   IMAGING: I personally ordered and reviewed the following images  MRI left hip  IMPRESSION: Moderate degenerative change bilateral hips greater on the left.   Mild degenerative change to the sacroiliac joints.  New Medications:  No orders of the defined types were placed in this encounter.     Oneil DELENA Horde, MD  12/14/2023 11:37 AM

## 2024-01-16 ENCOUNTER — Other Ambulatory Visit: Payer: Self-pay

## 2024-01-16 ENCOUNTER — Ambulatory Visit: Admitting: Sports Medicine

## 2024-01-16 ENCOUNTER — Encounter: Payer: Self-pay | Admitting: Sports Medicine

## 2024-01-16 DIAGNOSIS — M160 Bilateral primary osteoarthritis of hip: Secondary | ICD-10-CM | POA: Diagnosis not present

## 2024-01-16 DIAGNOSIS — G8929 Other chronic pain: Secondary | ICD-10-CM

## 2024-01-16 DIAGNOSIS — M25552 Pain in left hip: Secondary | ICD-10-CM | POA: Diagnosis not present

## 2024-01-16 DIAGNOSIS — M94352 Chondrolysis, left hip: Secondary | ICD-10-CM

## 2024-01-16 MED ORDER — LIDOCAINE HCL 1 % IJ SOLN
2.0000 mL | INTRAMUSCULAR | Status: AC | PRN
  Administered 2024-01-16: 2 mL

## 2024-01-16 MED ORDER — BUPIVACAINE HCL 0.25 % IJ SOLN
2.0000 mL | INTRAMUSCULAR | Status: AC | PRN
  Administered 2024-01-16: 2 mL via INTRA_ARTICULAR

## 2024-01-16 MED ORDER — BETAMETHASONE SOD PHOS & ACET 6 (3-3) MG/ML IJ SUSP
6.0000 mg | INTRAMUSCULAR | Status: AC | PRN
  Administered 2024-01-16: 6 mg via INTRA_ARTICULAR

## 2024-01-16 NOTE — Progress Notes (Signed)
 Maria Ball - 63 y.o. female MRN [id number]  Date of birth: 1961/01/16  Office Visit Note: Visit Date: 01/16/2024 PCP: Valerio Melanie DASEN, NP Referred by: Onesimo Oneil LABOR, MD  Subjective: Chief Complaint  Patient presents with   Left Hip - Pain   HPI: Maria Ball is a pleasant 63 y.o. female who presents today for chronic left hip and posterior buttock pain.  Orie has had pain in the left posterior hip and the buttock for about 1 year.  She occasionally will get pain radiating into the groin/anterior hip as well.  She feels an aching sharp pain over the anterior thigh as well.  She does notice restriction in range of motion with getting in and out of her car and with rotating about the hip.  She has restriction on the left greater than right side.  She has been seeing Dr. Onesimo up in [place] in the past.  She denies any numbness or tingling going down the leg.  She has tried over-the-counter anti-inflammatories, tramadol  only as needed, heat in the past.  She did have a type of hip injection which gave her some relief for a few days but nothing significant. Dr. Onesimo recommended further evaluation by myself and consideration of posterior hip injection +/- SN hydrodissection.  Pertinent ROS were reviewed with the patient and found to be negative unless otherwise specified above in HPI.   Assessment & Plan: Visit Diagnoses:  1. Chronic left hip pain   2. Bilateral primary osteoarthritis of hip   3. Chondrolysis of left hip    Plan: Impression is chronic left hip pain with x-ray and MRI confirmed evidence of moderate osteoarthritic change with areas of high-grade cartilage loss and chondrolysis of the left hip.  She does have pain emanating from the hip joint but also has pain over the posterior side near the ischial bursa.  Her our evaluation and recommendation from Dr. Onesimo, did proceed with ultrasound-guided posterior hip/ischial bursa injection with injectate  surrounding the exiting sciatic nerve.  Patient tolerated procedure well, advised on postinjection protocol.  Recommended heat, may take over-the-counter anti-inflammatories and does have tramadol  50 mg to take as needed for postinjection pain or breakthrough pain.  I would like her to get started back in formalized physical therapy to work on improving the range of motion about the hip joint specifically.  I would like to see her back in 3 weeks to reevaluate and see her progress from this procedure.  We did discuss considering trialing 1 additional diagnostic and therapeutic intra-articular hip injection in the future if this does not give her significant relief, I will see her back in 3 weeks to reevaluate and discuss further.  Follow-up: Return in about 3 weeks (around 02/06/2024) for For L-hip   Meds & Orders: No orders of the defined types were placed in this encounter.   Orders Placed This Encounter  Procedures   Large Joint Inj: L hip joint   US  Guided Needle Placement - No Linked Charges     Procedures: Large Joint Inj: L hip joint on 01/16/2024 3:15 PM Indications: pain Details: 22 G 3.5 in needle, ultrasound-guided posterior approach Medications: 2 mL lidocaine  1 %; 2 mL bupivacaine  0.25 %; 6 mg betamethasone  acetate-betamethasone  sodium phosphate 6 (3-3) MG/ML Outcome: tolerated well, no immediate complications  Procedure: US -guided ischial bursa injection, left posterior hip After discussion on risks/benefits/indications and informed verbal consent was obtained, a timeout was performed. Patient was lying prone on exam table.  The posterior hip and right buttock was cleaned with ChloraPrep and multiple alcohol swabs. Using ultrasound guidance, the ischial tuberosity and overlying ischial bursa was identified in a long-axis view. The overlying soft tissue was anesthetized with 4 cc of lidocaine  1% only.  Then, utilizing ultrasound guidance via an in-plane approach the needle was advanced  into the ischial bursa and around the descending sciatic nerve and subsequently injected with 2:2:1 lidocaine :bupivicaine:celestone  with ultrasound visualization of the injectate administered into the bursa. Patient tolerated procedure well without immediate complications.  Procedure, treatment alternatives, risks and benefits explained, specific risks discussed. Consent was given by the patient. Immediately prior to procedure a time out was called to verify the correct patient, procedure, equipment, support staff and site/side marked as required. Patient was prepped and draped in the usual sterile fashion.          Clinical History: No specialty comments available.  She reports that she has never smoked. She has never used smokeless tobacco. No results for input(s): HGBA1C, LABURIC in the last 8760 hours.  Objective:   Vital Signs: LMP  (LMP Unknown)   Physical Exam  Gen: Well-appearing, in no acute distress; non-toxic CV: Well-perfused. Warm.  Resp: Breathing unlabored on room air; no wheezing. Psych: Fluid speech in conversation; appropriate affect; normal thought process  Ortho Exam - Left hip: No redness swelling or effusion.  There is mild tenderness to palpation over the ischial bursa region and proximal hamstring tendon origin.  There is rather significant restriction in range of motion with pain with flexion, there is marked restriction with internal logroll with associated pain, about 5 degrees less of external logroll on the left compared to the right hip.  Positive Stinchfield and FADIR testing.  She has difficulty with FABER testing due to lack of inability to externally rotate.   Imaging:  I did review left hip MRI, there are at least moderate degenerative changes on the hips but there is pockets of diffuse cartilage loss with chondrolysis and associated subchondral cystic change in the left greater than right femoral head.  Narrative & Impression  EXAM DESCRIPTION: MR  HIP LEFT WO CONTRAST   CLINICAL HISTORY: Hip pain, chronic, articular cartilage eval, xray done; Hip pain, chronic, labral tear suspected, xray done   COMPARISON: None Available.   TECHNIQUE: MRI of the hip is performed according to our usual protocol with multiplanar multi sequence imaging.   FINDINGS: No fracture or dislocation. Moderate degenerative change to the bilateral hips greater on the left with diffuse chondrolysis and joint space narrowing. Subchondral cysts to the femoral heads bilaterally measuring up to 1 cm on the left. Also bilateral femoral head osteophytes. Mild degenerative edema. There is a 13 mm subchondral cyst in the posterior acetabulum on the left. Mild degenerative change to the sacroiliac joints. The marrow signal is otherwise unremarkable.   No active linear labral tear. No significant joint effusion or bursal fluid. The visualized tendons and musculature are unremarkable. No subcutaneous edema.     IMPRESSION: Moderate degenerative change bilateral hips greater on the left.   Mild degenerative change to the sacroiliac joints.   Electronically signed by: Reyes Frees MD 11/21/2023 03:00 PM EDT RP Workstation: [id number]    Past Medical/Family/Surgical/Social History: Medications & Allergies reviewed per EMR, new medications updated. Patient Active Problem List   Diagnosis Date Noted   H/O adenomatous polyp of colon    Vitamin D  deficiency 03/29/2021   Elevated low density lipoprotein (LDL) cholesterol level 03/28/2021   Basal  cell carcinoma 02/17/2021   DDD (degenerative disc disease), lumbar 02/10/2019   Obesity 12/12/2014   Hypertension 12/11/2014   Elevated alkaline phosphatase level 12/11/2014   Past Medical History:  Diagnosis Date   Alkaline phosphatase elevation    Basal cell carcinoma 02/17/2021   L of midline sup forehead - ED&C   Depression    Depression 12/11/2014   DVT (deep venous thrombosis) (HCC)    while taking OCP  in the 1980's   Hypertension    Shingles    Shingles    Family History  Problem Relation Age of Onset   Cancer Mother        colon-rectal   Emphysema Father    Cancer Sister        breast   Hypertension Sister    Diabetes Sister    Breast cancer Sister 35   Stroke Sister    Hypertension Brother    Past Surgical History:  Procedure Laterality Date   COLONOSCOPY WITH PROPOFOL  N/A 04/16/2018   Procedure: COLONOSCOPY WITH PROPOFOL ;  Surgeon: Therisa Bi, MD;  Location[facility] END[facility];  Service: Gastroenterology;  Laterality: N/A;   COLONOSCOPY WITH PROPOFOL  N/A 05/24/2021   Procedure: COLONOSCOPY WITH PROPOFOL ;  Surgeon: Unk Corinn Skiff, MD;  Location: [facility] ENDOSCOPY;  Service: Gastroenterology;  Laterality: N/A;   DILATION AND CURETTAGE OF UTERUS     TONSILLECTOMY     Social History   Occupational History   Not on file  Tobacco Use   Smoking status: Never   Smokeless tobacco: Never  Vaping Use   Vaping status: Never Used  Substance and Sexual Activity   Alcohol use: No    Alcohol/week: 0.0 standard drinks of alcohol   Drug use: No   Sexual activity: Never

## 2024-01-16 NOTE — Progress Notes (Signed)
 Patient has had pain in the left posterior hip and buttock for about a year. She says that she has tried managing with OTC medication but never had relief with that treatment. She was referred to physical therapy, although was advised to have an MRI first; she did not return to physical therapy after the MRI was done. She says that she has trouble getting into the car as she has pain when moving in her seat. She also has pain in her left thigh when going from seated to standing. She says that lateral movements do feel the worst. Patient denies pain, numbness, or tingling that goes beyond the knee and to the foot. She takes Tramadol  only as needed, and has tried heat in the past which did not give her any relief.

## 2024-01-30 ENCOUNTER — Ambulatory Visit: Admitting: Nurse Practitioner

## 2024-01-30 ENCOUNTER — Ambulatory Visit: Payer: Self-pay | Admitting: Nurse Practitioner

## 2024-01-30 ENCOUNTER — Encounter: Payer: Self-pay | Admitting: Nurse Practitioner

## 2024-01-30 VITALS — BP 124/73 | HR 90 | Temp 98.5°F | Ht 66.0 in | Wt 194.6 lb

## 2024-01-30 DIAGNOSIS — J0110 Acute frontal sinusitis, unspecified: Secondary | ICD-10-CM | POA: Diagnosis not present

## 2024-01-30 DIAGNOSIS — J029 Acute pharyngitis, unspecified: Secondary | ICD-10-CM

## 2024-01-30 LAB — POC COVID19/FLU A&B COMBO
Covid Antigen, POC: NEGATIVE
Influenza A Antigen, POC: NEGATIVE
Influenza B Antigen, POC: NEGATIVE

## 2024-01-30 MED ORDER — AMOXICILLIN 500 MG PO CAPS: 500.0000 mg | ORAL_CAPSULE | Freq: Two times a day (BID) | ORAL | 0 refills | Status: AC

## 2024-01-30 NOTE — Progress Notes (Signed)
 BP 124/73 (BP Location: Left Arm, Patient Position: Sitting, Cuff Size: Large)   Pulse 90   Temp 98.5 F (36.9 C) (Oral)   Ht 5' 6 (1.676 m)   Wt 194 lb 9.6 oz (88.3 kg)   LMP  (LMP Unknown)   SpO2 97%   BMI 31.41 kg/m    Subjective:    Patient ID: Maria Ball, female    DOB: 1960/11/10, 63 y.o.   MRN: [id number]  HPI: Maria Ball is a 63 y.o. female  Chief Complaint  Patient presents with   Cough   Sore Throat   Otalgia   head cold    Symptoms started this weekend, has become worse since the weekend, OTC medication for cold was prior treatment    Fatigue   UPPER RESPIRATORY TRACT INFECTION Worst symptom: Patient states she started with a head cold last week then over the weekend it went into her chest.   Fever: no Cough: yes Shortness of breath: yes Wheezing: no Chest pain: no Chest tightness: no Chest congestion: no Nasal congestion: yes Runny nose: yes Post nasal drip: no Sneezing: yes Sore throat: yes Swollen glands: no Sinus pressure: yes Headache: yes Face pain: yes Toothache: no Ear pain: yesbilateral Ear pressure: yesbilateral Eyes red/itching:no Eye drainage/crusting: no  Vomiting: no Rash: no Fatigue: yes Sick contacts: no Strep contacts: no  Context: worse Recurrent sinusitis: no Relief with OTC cold/cough medications: no  Treatments attempted: cold/sinus   Relevant past medical, surgical, family and social history reviewed and updated as indicated. Interim medical history since our last visit reviewed. Allergies and medications reviewed and updated.  Review of Systems  Constitutional:  Positive for fatigue. Negative for fever.  HENT:  Positive for congestion, ear pain, rhinorrhea, sinus pressure, sinus pain, sneezing and sore throat. Negative for dental problem and postnasal drip.   Respiratory:  Positive for cough and shortness of breath. Negative for wheezing.   Cardiovascular:  Negative for chest pain.   Gastrointestinal:  Negative for vomiting.  Skin:  Negative for rash.  Neurological:  Positive for headaches.    Per HPI unless specifically indicated above     Objective:    BP 124/73 (BP Location: Left Arm, Patient Position: Sitting, Cuff Size: Large)   Pulse 90   Temp 98.5 F (36.9 C) (Oral)   Ht 5' 6 (1.676 m)   Wt 194 lb 9.6 oz (88.3 kg)   LMP  (LMP Unknown)   SpO2 97%   BMI 31.41 kg/m   Wt Readings from Last 3 Encounters:  01/30/24 194 lb 9.6 oz (88.3 kg)  10/04/23 197 lb (89.4 kg)  04/03/23 194 lb 9.6 oz (88.3 kg)    Physical Exam Vitals and nursing note reviewed.  Constitutional:      General: She is not in acute distress.    Appearance: Normal appearance. She is normal weight. She is not ill-appearing, toxic-appearing or diaphoretic.  HENT:     Head: Normocephalic.     Right Ear: External ear normal. A middle ear effusion is present.     Left Ear: External ear normal. A middle ear effusion is present.     Nose: Congestion and rhinorrhea present.     Right Sinus: Frontal sinus tenderness present. No maxillary sinus tenderness.     Left Sinus: Frontal sinus tenderness present. No maxillary sinus tenderness.     Mouth/Throat:     Mouth: Mucous membranes are moist.     Pharynx: Oropharynx is clear. Posterior oropharyngeal  erythema present. No oropharyngeal exudate.  Eyes:     General:        Right eye: No discharge.        Left eye: No discharge.     Extraocular Movements: Extraocular movements intact.     Conjunctiva/sclera: Conjunctivae normal.     Pupils: Pupils are equal, round, and reactive to light.  Cardiovascular:     Rate and Rhythm: Normal rate and regular rhythm.     Heart sounds: No murmur heard. Pulmonary:     Effort: Pulmonary effort is normal. No respiratory distress.     Breath sounds: Normal breath sounds. No wheezing or rales.  Musculoskeletal:     Cervical back: Normal range of motion and neck supple.  Skin:    General: Skin is warm  and dry.     Capillary Refill: Capillary refill takes less than 2 seconds.  Neurological:     General: No focal deficit present.     Mental Status: She is alert and oriented to person, place, and time. Mental status is at baseline.  Psychiatric:        Mood and Affect: Mood normal.        Behavior: Behavior normal.        Thought Content: Thought content normal.        Judgment: Judgment normal.     Results for orders placed or performed in visit on 01/30/24  POC Covid19/Flu A&B Antigen   Collection Time: 01/30/24 11:43 AM  Result Value Ref Range   Influenza A Antigen, POC Negative Negative   Influenza B Antigen, POC Negative Negative   Covid Antigen, POC Negative Negative  Rapid Strep screen([facility])   Collection Time: 01/30/24 11:46 AM   Specimen: Other   Other  Result Value Ref Range   Strep Gp A Ag, IA W/Reflex Negative Negative  Culture, Group A Strep   Collection Time: 01/30/24 11:46 AM   Other  Result Value Ref Range   Strep A Culture WILL FOLLOW       Assessment & Plan:   Problem List Items Addressed This Visit   None Visit Diagnoses       Acute non-recurrent frontal sinusitis    -  Primary   Will treat with amoxicillin .  Complete course of medication.  Follow up if not improved.   Relevant Medications   amoxicillin  (AMOXIL ) 500 MG capsule     Sore throat       Relevant Orders   POC Covid19/Flu A&B Antigen (Completed)   Rapid Strep screen([facility]) (Completed)        Follow up plan: No follow-ups on file.

## 2024-02-02 LAB — RAPID STREP SCREEN (MED CTR MEBANE ONLY): Strep Gp A Ag, IA W/Reflex: NEGATIVE

## 2024-02-02 LAB — CULTURE, GROUP A STREP

## 2024-02-06 ENCOUNTER — Other Ambulatory Visit: Payer: Self-pay

## 2024-02-06 ENCOUNTER — Ambulatory Visit: Admitting: Sports Medicine

## 2024-02-06 ENCOUNTER — Encounter: Payer: Self-pay | Admitting: Sports Medicine

## 2024-02-06 DIAGNOSIS — M25552 Pain in left hip: Secondary | ICD-10-CM | POA: Diagnosis not present

## 2024-02-06 DIAGNOSIS — G8929 Other chronic pain: Secondary | ICD-10-CM

## 2024-02-06 DIAGNOSIS — M94352 Chondrolysis, left hip: Secondary | ICD-10-CM | POA: Diagnosis not present

## 2024-02-06 DIAGNOSIS — M1612 Unilateral primary osteoarthritis, left hip: Secondary | ICD-10-CM | POA: Diagnosis not present

## 2024-02-06 MED ORDER — METHYLPREDNISOLONE ACETATE 40 MG/ML IJ SUSP
40.0000 mg | INTRAMUSCULAR | Status: AC | PRN
  Administered 2024-02-06: 40 mg via INTRA_ARTICULAR

## 2024-02-06 MED ORDER — LIDOCAINE HCL 1 % IJ SOLN
4.0000 mL | INTRAMUSCULAR | Status: AC | PRN
  Administered 2024-02-06: 4 mL

## 2024-02-06 NOTE — Progress Notes (Signed)
 Patient says that the injection has taken care of her posterior hip/glute pain entirely. She says that if she shifts in her chair, she will sometimes feel a twinge, but nothing like she felt prior to the injection. She does get some pain through the front of the hip and into the thigh when going from seated to standing, which did not improve at all with the injection. This pain does not go into the groin, but is consistently in the anterior hip and upper thigh.

## 2024-02-06 NOTE — Patient Instructions (Addendum)
 Dr. Burnetta' Guide for Management of Osteoarthritis:  1.)  Keep your body moving - keep working on flexibility and range of motion for the knee.  Good physical activity exercise include: stationary bike, swimming, elliptical.  2.)  Maintain a healthy weight --> 1 pound of body weight = 4 pounds of weight on the knees.  Even small weight loss (5-10+ lbs) can significantly improve pain  3.)  Supplements helpful for cartilage support, arthritis and inflammation: Turmeric (curcumin) Boswellia serrata, collagen hydrolysate, Glucosamine-chondroitin, Omega-3 fatty acids  4.)  Foods helpful for cartilage support, arthritis and inflammation: Fish and foods containing omega-3's, leafy greens (broccoli, spinach), citrus fruits (grapefruit, oranges, lemons), green tea, blueberries, cherries, olive oil (EVO)  *It is very important to stay hydrated, drinking lots of water  throughout the day. This helps hydrate structures within the knee.  5.) Medicines:    - Topical pain relievers: Voltaren gel, Arnica gel, Tiger balm, lidocaine /IcyHot patches    - Anti-inflammatories like Aleve, Motrin, ibuprofen --> we can consider prescription based NSAIDs as well if needed

## 2024-02-06 NOTE — Progress Notes (Signed)
 Maria Ball - 63 y.o. female MRN [id number]  Date of birth: 1960/10/05  Office Visit Note: Visit Date: 02/06/2024 PCP: Valerio Melanie DASEN, NP Referred by: Valerio Melanie DASEN, NP  Subjective: Chief Complaint  Patient presents with   Left Hip - Follow-up   HPI: Maria Ball is a pleasant 63 y.o. female who presents today for follow-up of chronic left hip pain.  Back on 01/16/2024 we did perform ultrasound-guided ischial bursa injection with HD around the sciatic nerve.  She feels that this significantly helped her pain and essentially resolved all of her posterior hip and gluteal pain entirely.  She still feels a twinge more so over the anterior hip and the thigh.  This is worse when she goes from sitting to standing.  In general she is largely improved but still has pain over the anterior aspect of the hip.  She has held from formalized physical therapy because she wanted to see how the injection worked did not exacerbate her pain.  Pertinent ROS were reviewed with the patient and found to be negative unless otherwise specified above in HPI.   Assessment & Plan: Visit Diagnoses:  1. Unilateral primary osteoarthritis, left hip   2. Chronic left hip pain   3. Chondrolysis of left hip    Plan: Impression is overall improved chronic left hip pain which is multifactorial in nature.  She did have ischial bursitis with functional sciatic neuropathy which essentially resolved after our ultrasound-guided injection and HD.  She is still dealing with anterior hip pain and more pain emanating from the hip joint as her MRI does show chondrolysis which is higher grade as well as at least moderate hip osteoarthritis.  We discussed for both diagnostic and further therapeutic purposes proceeding with ultrasound-guided intra-articular hip injection, patient tolerated well.  I advised on postinjection protocol.  May use ice/heat as well as Tylenol or over-the-counter anti-inflammatories in the  short-term.  Following the next 7-10 days after injection, I would like her to get started back in formalized physical therapy to work on strengthening and stabilization about the hip joint as well as activation of the posterior chain and hamstrings from her previous ischial bursa pathology.  I will see her back over the next 6-8 weeks for reevaluation.  Did discuss with Maria general supplementation for overall cartilage and joint preservation health, see AVS and printed out sheet.  Given her degree of cartilage loss and hip OA, down the line she still could be looking at a total hip arthroplasty, although would like to hold on this if possible.  Follow-up: Return in about 7 weeks (around 03/26/2024) for [employer] .   Meds & Orders: No orders of the defined types were placed in this encounter.   Orders Placed This Encounter  Procedures   Large Joint Inj   US  Guided Needle Placement - No Linked Charges     Procedures: Large Joint Inj: L hip joint on 02/06/2024 10:43 AM Indications: pain Details: 22 G 3.5 in needle, ultrasound-guided anterior approach Medications: 4 mL lidocaine  1 %; 40 mg methylPREDNISolone  acetate 40 MG/ML Outcome: tolerated well, no immediate complications  Procedure: US -guided intra-articular hip injection, Left After discussion on risks/benefits/indications and informed verbal consent was obtained, a timeout was performed. Patient was lying supine on exam table. The hip was cleaned with betadine and alcohol swabs. Then utilizing ultrasound guidance, the patient's femoral head and neck junction was identified and subsequently injected with 4:1 lidocaine :depomedrol via an in-plane approach with ultrasound  visualization of the injectate administered into the hip joint. Patient tolerated procedure well without immediate complications.  Procedure, treatment alternatives, risks and benefits explained, specific risks discussed. Consent was given by the patient. Immediately  prior to procedure a time out was called to verify the correct patient, procedure, equipment, support staff and site/side marked as required. Patient was prepped and draped in the usual sterile fashion.          Clinical History: No specialty comments available.  She reports that she has never smoked. She has never used smokeless tobacco. No results for input(s): HGBA1C, LABURIC in the last 8760 hours.  Objective:   Vital Signs: LMP  (LMP Unknown)   Physical Exam  Gen: Well-appearing, in no acute distress; non-toxic CV: Well-perfused. Warm.  Resp: Breathing unlabored on room air; no wheezing. Psych: Fluid speech in conversation; appropriate affect; normal thought process  Ortho Exam - Left hip: No pain over the posterior buttock or ischial bursa.  No redness swelling or effusion about the hip joint.  There is about 5 to 7 degrees less of both internal and external logroll compared to the contralateral hip.  Positive FADIR and positive Stinchfield testing of the left hip.  Imaging:  Narrative & Impression  EXAM DESCRIPTION: MR HIP LEFT WO CONTRAST   CLINICAL HISTORY: Hip pain, chronic, articular cartilage eval, xray done; Hip pain, chronic, labral tear suspected, xray done   COMPARISON: None Available.   TECHNIQUE: MRI of the hip is performed according to our usual protocol with multiplanar multi sequence imaging.   FINDINGS: No fracture or dislocation. Moderate degenerative change to the bilateral hips greater on the left with diffuse chondrolysis and joint space narrowing. Subchondral cysts to the femoral heads bilaterally measuring up to 1 cm on the left. Also bilateral femoral head osteophytes. Mild degenerative edema. There is a 13 mm subchondral cyst in the posterior acetabulum on the left. Mild degenerative change to the sacroiliac joints. The marrow signal is otherwise unremarkable.   No active linear labral tear. No significant joint effusion or bursal  fluid. The visualized tendons and musculature are unremarkable. No subcutaneous edema.     IMPRESSION: Moderate degenerative change bilateral hips greater on the left.   Mild degenerative change to the sacroiliac joints.   Electronically signed by: Ball Frees MD 11/21/2023 03:00 PM EDT RP Workstation: [id number]    Past Medical/Family/Surgical/Social History: Medications & Allergies reviewed per EMR, new medications updated. Patient Active Problem List   Diagnosis Date Noted   H/O adenomatous polyp of colon    Vitamin D  deficiency 03/29/2021   Elevated low density lipoprotein (LDL) cholesterol level 03/28/2021   Basal cell carcinoma 02/17/2021   DDD (degenerative disc disease), lumbar 02/10/2019   Obesity 12/12/2014   Hypertension 12/11/2014   Elevated alkaline phosphatase level 12/11/2014   Past Medical History:  Diagnosis Date   Alkaline phosphatase elevation    Basal cell carcinoma 02/17/2021   L of midline sup forehead - ED&C   Depression    Depression 12/11/2014   DVT (deep venous thrombosis) (HCC)    while taking OCP in the 1980's   Hypertension    Shingles    Shingles    Family History  Problem Relation Age of Onset   Cancer Mother        colon-rectal   Emphysema Father    Cancer Sister        breast   Hypertension Sister    Diabetes Sister    Breast cancer Sister 22  Stroke Sister    Hypertension Brother    Past Surgical History:  Procedure Laterality Date   COLONOSCOPY WITH PROPOFOL  N/A 04/16/2018   Procedure: COLONOSCOPY WITH PROPOFOL ;  Surgeon: Therisa Bi, MD;  Location: [facility] ENDOSCOPY;  Service: Gastroenterology;  Laterality: N/A;   COLONOSCOPY WITH PROPOFOL  N/A 05/24/2021   Procedure: COLONOSCOPY WITH PROPOFOL ;  Surgeon: Unk Corinn Skiff, MD;  Location: [facility] ENDOSCOPY;  Service: Gastroenterology;  Laterality: N/A;   DILATION AND CURETTAGE OF UTERUS     TONSILLECTOMY     Social History   Occupational History   Not on file  Tobacco  Use   Smoking status: Never   Smokeless tobacco: Never  Vaping Use   Vaping status: Never Used  Substance and Sexual Activity   Alcohol use: No    Alcohol/week: 0.0 standard drinks of alcohol   Drug use: No   Sexual activity: Never

## 2024-02-14 ENCOUNTER — Ambulatory Visit: Payer: Self-pay

## 2024-02-14 NOTE — Telephone Encounter (Signed)
 FYI Only or Action Required?: Action required by provider: request for appointment.  Patient was last seen in primary care on 01/30/2024 by Melvin Pao, NP.  Called Nurse Triage reporting Tinnitus.  Symptoms began several days ago.  Interventions attempted: Nothing.  Symptoms are: unchanged.  Triage Disposition: See PCP When Office is Open (Within 3 Days)  Patient/caregiver understands and will follow disposition?: YesCopied from CRM [id number]. Topic: Clinical - Red Word Triage >> Feb 14, 2024 10:07 AM Donee H wrote: [facility] that prompted transfer to Nurse Triage: Patient calling regarding issues with ears. She states she is experiencing ringing in ears since the weekend. She also mentioned feeling dizzy when laying down. She described it has when laying down in bed and turning over she becomes dizzy. No leakage in ear that she has noticed. Originally called in to set an appointment to be been by a doctor. She stated it doesn't have to be pcp but just want to get checked out. Reason for Disposition  MODERATE-SEVERE tinnitus (i.e., interferes with work, school, or sleep)  Answer Assessment - Initial Assessment Questions I feel like my ears need to pop. Hearing is muffled in left ear. I get dizzy while lying down if I rotate from side to side. Recent sinus infection 9/30 and recently finished amoxicillin .    1. DESCRIPTION: Describe the sound you are hearing. (e.g., buzzing, hissing, humming, ringing)     Soft roar 2. LOCATION: Is the sound in one or both ears? If one, ask: Which ear?   Both but left is worse 3. SEVERITY: How bad is it?   4. ONSET: When did this begin? Did it start suddenly or come on gradually?     weekend 5. PATTERN: Does this come and go, or has it been constant since it started?     Not sure 6. HEARING LOSS: Is your hearing decreased? (e.g., normal, decreased)       Decreased in left ear 7. OTHER SYMPTOMS: Do you have any other symptoms?  (e.g., dizziness, earache)     dizzy  Protocols used: Tinnitus-A-AH

## 2024-02-15 ENCOUNTER — Ambulatory Visit: Admitting: Nurse Practitioner

## 2024-02-15 ENCOUNTER — Encounter: Payer: Self-pay | Admitting: Nurse Practitioner

## 2024-02-15 VITALS — BP 145/71 | HR 88 | Temp 98.0°F | Ht 66.0 in | Wt 195.2 lb

## 2024-02-15 DIAGNOSIS — H6592 Unspecified nonsuppurative otitis media, left ear: Secondary | ICD-10-CM | POA: Diagnosis not present

## 2024-02-15 MED ORDER — METHYLPREDNISOLONE 4 MG PO TBPK: ORAL_TABLET | ORAL | 0 refills | Status: DC

## 2024-02-15 NOTE — Progress Notes (Signed)
 BP (!) 145/71   Pulse 88   Temp 98 F (36.7 C) (Oral)   Ht 5' 6 (1.676 m)   Wt 195 lb 3.2 oz (88.5 kg)   LMP  (LMP Unknown)   SpO2 98%   BMI 31.51 kg/m    Subjective:    Patient ID: Maria Ball, female    DOB: January 06, 1961, 63 y.o.   MRN: [id number]  HPI: Maria Ball is a 62 y.o. female  Chief Complaint  Patient presents with   Ear Problem    Patient states she has been experiencing a dull roar in her ears since this past weekend. States she fills this more in the L ear. States she feels like her ears need to pop   Patient presents to clinic with complaints of a dull roar in her ears since this past weekend.  States it is more in the left than the right.  She does feel like her ears need to pop.  She has had ear infections before.  Was treated for a sinus infection at the end of September.  She gets dizzy when she rolls over at night.      Relevant past medical, surgical, family and social history reviewed and updated as indicated. Interim medical history since our last visit reviewed. Allergies and medications reviewed and updated.  Review of Systems  HENT:         Dull roar in ear  Neurological:  Positive for dizziness.    Per HPI unless specifically indicated above     Objective:    BP (!) 145/71   Pulse 88   Temp 98 F (36.7 C) (Oral)   Ht 5' 6 (1.676 m)   Wt 195 lb 3.2 oz (88.5 kg)   LMP  (LMP Unknown)   SpO2 98%   BMI 31.51 kg/m   Wt Readings from Last 3 Encounters:  02/15/24 195 lb 3.2 oz (88.5 kg)  01/30/24 194 lb 9.6 oz (88.3 kg)  10/04/23 197 lb (89.4 kg)    Physical Exam Vitals and nursing note reviewed.  Constitutional:      General: She is not in acute distress.    Appearance: Normal appearance. She is normal weight. She is not ill-appearing, toxic-appearing or diaphoretic.  HENT:     Head: Normocephalic.     Right Ear: External ear normal. No tenderness. A middle ear effusion is present. Tympanic membrane is not  erythematous.     Left Ear: External ear normal. No tenderness. A middle ear effusion is present. Tympanic membrane is not erythematous.     Nose: Nose normal.     Mouth/Throat:     Mouth: Mucous membranes are moist.     Pharynx: Oropharynx is clear.  Eyes:     General:        Right eye: No discharge.        Left eye: No discharge.     Extraocular Movements: Extraocular movements intact.     Conjunctiva/sclera: Conjunctivae normal.     Pupils: Pupils are equal, round, and reactive to light.  Cardiovascular:     Rate and Rhythm: Normal rate and regular rhythm.     Heart sounds: No murmur heard. Pulmonary:     Effort: Pulmonary effort is normal. No respiratory distress.     Breath sounds: Normal breath sounds. No wheezing or rales.  Musculoskeletal:     Cervical back: Normal range of motion and neck supple.  Skin:    General: Skin is  warm and dry.     Capillary Refill: Capillary refill takes less than 2 seconds.  Neurological:     General: No focal deficit present.     Mental Status: She is alert and oriented to person, place, and time. Mental status is at baseline.  Psychiatric:        Mood and Affect: Mood normal.        Behavior: Behavior normal.        Thought Content: Thought content normal.        Judgment: Judgment normal.     Results for orders placed or performed in visit on 01/30/24  POC Covid19/Flu A&B Antigen   Collection Time: 01/30/24 11:43 AM  Result Value Ref Range   Influenza A Antigen, POC Negative Negative   Influenza B Antigen, POC Negative Negative   Covid Antigen, POC Negative Negative  Rapid Strep screen([facility])   Collection Time: 01/30/24 11:46 AM   Specimen: Other   Other  Result Value Ref Range   Strep Gp A Ag, IA W/Reflex Negative Negative  Culture, Group A Strep   Collection Time: 01/30/24 11:46 AM   Other  Result Value Ref Range   Strep A Culture Negative       Assessment & Plan:   Problem List Items Addressed This Visit    None Visit Diagnoses       Fluid level behind tympanic membrane of left ear    -  Primary   Will treat with Medrol  dose pak. Complete course of medication. Recommend antihistamine (Zyrect, Allegra, Claritin) during allergy season to help symptoms.        Follow up plan: No follow-ups on file.

## 2024-03-11 ENCOUNTER — Encounter: Payer: Self-pay | Admitting: Radiology

## 2024-03-13 ENCOUNTER — Ambulatory Visit: Payer: Self-pay | Admitting: *Deleted

## 2024-03-13 NOTE — Telephone Encounter (Signed)
 Noted

## 2024-03-13 NOTE — Telephone Encounter (Signed)
 FYI Only or Action Required?: FYI only for provider: appointment scheduled on 03/15/24.  Patient was last seen in primary care on 02/15/2024 by Melvin Pao, NP.  Called Nurse Triage reporting Otalgia.  Symptoms began several weeks ago.  Interventions attempted: OTC medications: steroid pack  and Prescription medications: OTC [employer] antihistamine .  Symptoms are: unchanged.  Triage Disposition: See PCP When Office is Open (Within 3 Days)  Patient/caregiver understands and will follow disposition?: Yes         copied from CRM [id number]. Topic: Clinical - Red Word Triage >> Mar 13, 2024  1:54 PM Gustabo D wrote: Pt was given steroids for her left ear and it's not getting better. Reason for Disposition  Recurrent episodes of hearing loss, dizziness, and ringing in the ear  Answer Assessment - Initial Assessment Questions Appt scheduled 03/15/24. Earliest available . Patient has been taking OTC antihistamine from [employer] brand and not effective. Completed steroid pack since starting 02/15/24. Please advise if appt needed or if additional medication can be prescribed. Patient last OV 02/15/24       1. DESCRIPTION: Describe the sound you are hearing. (e.g., buzzing, hissing, humming, ringing)     Constant roar dull , muffled sound.   2. LOCATION: Is the sound in one or both ears? If one, ask: Which ear?     Left ear  3. SEVERITY: How bad is it?      Not getting better since steroids taken 02/15/24 4. ONSET: When did this begin? Did it start suddenly or come on gradually?     Before 02/15/24  5. PATTERN: Does this come and go, or has it been constant since it started?     constant 6. HEARING LOSS: Is your hearing decreased? (e.g., normal, decreased)       Muffled sound 7. OTHER SYMPTOMS: Do you have any other symptoms? (e.g., dizziness, earache)     Muffled sound left ear. Dizziness laying down and turning.  8. PREGNANCY: Is there any chance you are  pregnant? When was your last menstrual period?     na  Protocols used: Tinnitus-A-AH

## 2024-03-15 ENCOUNTER — Ambulatory Visit: Admitting: Nurse Practitioner

## 2024-03-26 ENCOUNTER — Encounter: Payer: Self-pay | Admitting: Sports Medicine

## 2024-03-26 ENCOUNTER — Ambulatory Visit: Admitting: Sports Medicine

## 2024-03-26 DIAGNOSIS — M160 Bilateral primary osteoarthritis of hip: Secondary | ICD-10-CM | POA: Diagnosis not present

## 2024-03-26 DIAGNOSIS — M25652 Stiffness of left hip, not elsewhere classified: Secondary | ICD-10-CM

## 2024-03-26 DIAGNOSIS — M94352 Chondrolysis, left hip: Secondary | ICD-10-CM | POA: Diagnosis not present

## 2024-03-26 DIAGNOSIS — M25651 Stiffness of right hip, not elsewhere classified: Secondary | ICD-10-CM

## 2024-03-26 DIAGNOSIS — R29898 Other symptoms and signs involving the musculoskeletal system: Secondary | ICD-10-CM | POA: Diagnosis not present

## 2024-03-26 NOTE — Progress Notes (Signed)
 Patient says that she is doing very well from the injection. Her primary concern today is the stiffness in her hip. She says that she has increased stiffness after prolonged sitting, and it takes her a few steps to get moving. She also mentions that walking does not help with the stiffness, and sometimes makes it worse. She has not done physical therapy yet as she wanted to see how she did with the injection first, although she does plan to schedule physical therapy in the future.

## 2024-03-26 NOTE — Progress Notes (Addendum)
 Maria Ball - 63 y.o. female MRN [id number]  Date of birth: 1960/11/26  Office Visit Note: Visit Date: 03/26/2024 PCP: Valerio Melanie DASEN, NP Referred by: Valerio Melanie DASEN, NP  Subjective: Chief Complaint  Patient presents with   Left Hip - Follow-up   HPI: Maria Ball is a pleasant 63 y.o. female who presents today for follow-up of left hip pain > right hip pain with stiffness.  Jamelia states the left hip is doing well from a pain standpoint.  As a reminder, we did perform posterior ischial bursa with sciatic nerve hydrodissection injection back on 9/9 as well as a left hip intra-articular injection on 02/06/2024.  This helped her pain quite a bit.  Her main concern is more stiffness in both hips although left is worse than right.  The right hip does bother her at times as well although it is not as severe as the left hip was.  She has stiffness whether she sits or when she walks.  She has not yet done formalized physical therapy but is open to doing this to help with range of motion and mobility.  She has used Tylenol or NSAIDs in the past, although does not want to take these consistently.  She does take a half of a tramadol  50 mg for breakthrough pain only, uses this very sparingly.  Pertinent ROS were reviewed with the patient and found to be negative unless otherwise specified above in HPI.   Assessment & Plan: Visit Diagnoses:  1. Bilateral primary osteoarthritis of hip   2. Chondrolysis of left hip   3. Hip joint stiffness, bilateral   4. Weakness of both hips    Plan: Impression is chronic bilateral hip pain and stiffness that has moderate-severe left and moderate right osteoarthritic change.  Previous MRI also shows chondrolysis which is quite diffuse about the left hip and subchondral cystic change.  She received quite good relief from left hip injection under ultrasound guidance.  Her pain is much more controlled but still having stiffness in both of her  hips.  Discussed with her this is mostly from her underlying arthritis as well as cartilage loss within the hip joints.  There is a role to work on range of motion and flexibility within the hip joint and pelvis however, given this she would like to return to physical therapy.  Referral sent to [facility] PT today to work on mobility/flexibility as well as strength in her hip abductors.  I would like to see what sort of progress she makes in about 6 weeks and at that point we will reevaluate.  She may use her tramadol  1/2 tablet of 50 mg as needed for breakthrough pain only.  Okay for Tylenol in the interim as needed as well.  I did discuss with her that if she does not receive significant range of motion or flexibility benefits from PT, a permanent step would be consideration of total hip arthroplasty.  Will hold on this for now.  Follow-up in 6 weeks.  Follow-up: Return in about 6 weeks (around 05/07/2024) for bilateral hips.   Meds & Orders: No orders of the defined types were placed in this encounter.  No orders of the defined types were placed in this encounter.    Procedures: No procedures performed      Clinical History: No specialty comments available.  She reports that she has never smoked. She has never used smokeless tobacco. No results for input(s): HGBA1C, LABURIC in the last 8760  hours.  Objective:   Vital Signs: LMP  (LMP Unknown)   Physical Exam  Gen: Well-appearing, in no acute distress; non-toxic CV: Well-perfused. Warm.  Resp: Breathing unlabored on room air; no wheezing. Psych: Fluid speech in conversation; appropriate affect; normal thought process  Ortho Exam - Bilateral hips: Very minimal TTP of the posterior aspect of the right greater trochanteric region.  There is limited internal and external range of motion at end range of motion.  There is significant limited rotation with FADIR testing and associated pain bilaterally.  FABER testing does not reproduce pain but  she has limited motion as the knee abducts towards the table.  There is 4/5 strength bilaterally with resisted hip abduction strength testing.  Imaging:  MR HIP LEFT WO CONTRAST EXAM DESCRIPTION: MR HIP LEFT WO CONTRAST  CLINICAL HISTORY: Hip pain, chronic, articular cartilage eval, xray done; Hip pain, chronic, labral tear suspected, xray done  COMPARISON: None Available.  TECHNIQUE: MRI of the hip is performed according to our usual protocol with multiplanar multi sequence imaging.  FINDINGS: No fracture or dislocation. Moderate degenerative change to the bilateral hips greater on the left with diffuse chondrolysis and joint space narrowing. Subchondral cysts to the femoral heads bilaterally measuring up to 1 cm on the left. Also bilateral femoral head osteophytes. Mild degenerative edema. There is a 13 mm subchondral cyst in the posterior acetabulum on the left. Mild degenerative change to the sacroiliac joints. The marrow signal is otherwise unremarkable.  No active linear labral tear. No significant joint effusion or bursal fluid. The visualized tendons and musculature are unremarkable. No subcutaneous edema.  IMPRESSION: Moderate degenerative change bilateral hips greater on the left.  Mild degenerative change to the sacroiliac joints.  Electronically signed by: Reyes Frees MD 11/21/2023 03:00 PM EDT RP Workstation: [id number]   Past Medical/Family/Surgical/Social History: Medications & Allergies reviewed per EMR, new medications updated. Patient Active Problem List   Diagnosis Date Noted   H/O adenomatous polyp of colon    Vitamin D  deficiency 03/29/2021   Elevated low density lipoprotein (LDL) cholesterol level 03/28/2021   Basal cell carcinoma 02/17/2021   DDD (degenerative disc disease), lumbar 02/10/2019   Obesity 12/12/2014   Hypertension 12/11/2014   Elevated alkaline phosphatase level 12/11/2014   Past Medical History:  Diagnosis Date   Alkaline  phosphatase elevation    Basal cell carcinoma 02/17/2021   L of midline sup forehead - ED&C   Depression    Depression 12/11/2014   DVT (deep venous thrombosis) (HCC)    while taking OCP in the 1980's   Hypertension    Shingles    Shingles    Family History  Problem Relation Age of Onset   Cancer Mother        colon-rectal   Emphysema Father    Cancer Sister        breast   Hypertension Sister    Diabetes Sister    Breast cancer Sister 87   Stroke Sister    Hypertension Brother    Past Surgical History:  Procedure Laterality Date   COLONOSCOPY WITH PROPOFOL  N/A 04/16/2018   Procedure: COLONOSCOPY WITH PROPOFOL ;  Surgeon: Therisa Bi, MD;  Location: [facility] ENDOSCOPY;  Service: Gastroenterology;  Laterality: N/A;   COLONOSCOPY WITH PROPOFOL  N/A 05/24/2021   Procedure: COLONOSCOPY WITH PROPOFOL ;  Surgeon: Unk Corinn Skiff, MD;  Location: [facility] ENDOSCOPY;  Service: Gastroenterology;  Laterality: N/A;   DILATION AND CURETTAGE OF UTERUS     TONSILLECTOMY     Social  History   Occupational History   Not on file  Tobacco Use   Smoking status: Never   Smokeless tobacco: Never  Vaping Use   Vaping status: Never Used  Substance and Sexual Activity   Alcohol use: No    Alcohol/week: 0.0 standard drinks of alcohol   Drug use: No   Sexual activity: Never

## 2024-03-31 NOTE — Patient Instructions (Signed)
 Be Involved in Caring For Your Health:  Taking Medications When medications are taken as directed, they can greatly improve your health. But if they are not taken as prescribed, they may not work. In some cases, not taking them correctly can be harmful. To help ensure your treatment remains effective and safe, understand your medications and how to take them. Bring your medications to each visit for review by your provider.  Your lab results, notes, and after visit summary will be available on My Chart. We strongly encourage you to use this feature. If lab results are abnormal the clinic will contact you with the appropriate steps. If the clinic does not contact you assume the results are satisfactory. You can always view your results on My Chart. If you have questions regarding your health or results, please contact the clinic during office hours. You can also ask questions on My Chart.  We at [facility] are grateful that you chose us  to provide your care. We strive to provide evidence-based and compassionate care and are always looking for feedback. If you get a survey from the clinic please complete this so we can hear your opinions.  Healthy Eating, Adult Healthy eating may help you get and keep a healthy body weight, reduce the risk of chronic disease, and live a long and productive life. It is important to follow a healthy eating pattern. Your nutritional and calorie needs should be met mainly by different nutrient-rich foods. What are tips for following this plan? Reading food labels Read labels and choose the following: Reduced or low sodium products. Juices with 100% fruit juice. Foods with low saturated fats (<3 g per serving) and high polyunsaturated and monounsaturated fats. Foods with whole grains, such as whole wheat, cracked wheat, brown rice, and wild rice. Whole grains that are fortified with folic acid. This is recommended for females who are pregnant or who want to  become pregnant. Read labels and do not eat or drink the following: Foods or drinks with added sugars. These include foods that contain brown sugar, corn sweetener, corn syrup, dextrose , fructose, glucose, high-fructose corn syrup, honey, invert sugar, lactose, malt syrup, maltose, molasses, raw sugar, sucrose, trehalose, or turbinado sugar. Limit your intake of added sugars to less than 10% of your total daily calories. Do not eat more than the following amounts of added sugar per day: 6 teaspoons (25 g) for females. 9 teaspoons (38 g) for males. Foods that contain processed or refined starches and grains. Refined grain products, such as white flour, degermed cornmeal, white bread, and white rice. Shopping Choose nutrient-rich snacks, such as vegetables, whole fruits, and nuts. Avoid high-calorie and high-sugar snacks, such as potato chips, fruit snacks, and candy. Use oil-based dressings and spreads on foods instead of solid fats such as butter, margarine, sour cream, or cream cheese. Limit pre-made sauces, mixes, and instant products such as flavored rice, instant noodles, and ready-made pasta. Try more plant-protein sources, such as tofu, tempeh, black beans, edamame, lentils, nuts, and seeds. Explore eating plans such as the Mediterranean diet or vegetarian diet. Try heart-healthy dips made with beans and healthy fats like hummus and guacamole. Vegetables go great with these. Cooking Use oil to saut or stir-fry foods instead of solid fats such as butter, margarine, or lard. Try baking, boiling, grilling, or broiling instead of frying. Remove the fatty part of meats before cooking. Steam vegetables in water  or broth. Meal planning  At meals, imagine dividing your plate into fourths: One-half of  your plate is fruits and vegetables. One-fourth of your plate is whole grains. One-fourth of your plate is protein, especially lean meats, poultry, eggs, tofu, beans, or nuts. Include low-fat  dairy as part of your daily diet. Lifestyle Choose healthy options in all settings, including home, work, school, restaurants, or stores. Prepare your food safely: Wash your hands after handling raw meats. Where you prepare food, keep surfaces clean by regularly washing with hot, soapy water . Keep raw meats separate from ready-to-eat foods, such as fruits and vegetables. Cook seafood, meat, poultry, and eggs to the recommended temperature. Get a food thermometer. Store foods at safe temperatures. In general: Keep cold foods at 84F (4.4C) or below. Keep hot foods at 184F (60C) or above. Keep your freezer at [facility] (-17.8C) or below. Foods are not safe to eat if they have been between the temperatures of 40-184F (4.4-60C) for more than 2 hours. What foods should I eat? Fruits Aim to eat 1-2 cups of fresh, canned (in natural juice), or frozen fruits each day. One cup of fruit equals 1 small apple, 1 large banana, 8 large strawberries, 1 cup (237 g) canned fruit,  cup (82 g) dried fruit, or 1 cup (240 mL) 100% juice. Vegetables Aim to eat 2-4 cups of fresh and frozen vegetables each day, including different varieties and colors. One cup of vegetables equals 1 cup (91 g) broccoli or cauliflower florets, 2 medium carrots, 2 cups (150 g) raw, leafy greens, 1 large tomato, 1 large bell pepper, 1 large sweet potato, or 1 medium white potato. Grains Aim to eat 5-10 ounce-equivalents of whole grains each day. Examples of 1 ounce-equivalent of grains include 1 slice of bread, 1 cup (40 g) ready-to-eat cereal, 3 cups (24 g) popcorn, or  cup (93 g) cooked rice. Meats and other proteins Try to eat 5-7 ounce-equivalents of protein each day. Examples of 1 ounce-equivalent of protein include 1 egg,  oz nuts (12 almonds, 24 pistachios, or 7 walnut halves), 1/4 cup (90 g) cooked beans, 6 tablespoons (90 g) hummus or 1 tablespoon (16 g) peanut butter. A cut of meat or fish that is the size of a deck of  cards is about 3-4 ounce-equivalents (85 g). Of the protein you eat each week, try to have at least 8 sounce (227 g) of seafood. This is about 2 servings per week. This includes salmon, trout, herring, sardines, and anchovies. Dairy Aim to eat 3 cup-equivalents of fat-free or low-fat dairy each day. Examples of 1 cup-equivalent of dairy include 1 cup (240 mL) milk, 8 ounces (250 g) yogurt, 1 ounces (44 g) natural cheese, or 1 cup (240 mL) fortified soy milk. Fats and oils Aim for about 5 teaspoons (21 g) of fats and oils per day. Choose monounsaturated fats, such as canola and olive oils, mayonnaise made with olive oil or avocado oil, avocados, peanut butter, and most nuts, or polyunsaturated fats, such as sunflower, corn, and soybean oils, walnuts, pine nuts, sesame seeds, sunflower seeds, and flaxseed. Beverages Aim for 6 eight-ounce glasses of water  per day. Limit coffee to 3-5 eight-ounce cups per day. Limit caffeinated beverages that have added calories, such as soda and energy drinks. If you drink alcohol: Limit how much you have to: 0-1 drink a day if you are female. 0-2 drinks a day if you are female. Know how much alcohol is in your drink. In the [place] one drink is one 12 oz bottle of beer (355 mL), one 5 oz glass of wine (  148 mL), or one 1 oz glass of hard liquor (44 mL). Seasoning and other foods Try not to add too much salt to your food. Try using herbs and spices instead of salt. Try not to add sugar to food. This information is based on [place] nutrition guidelines. To learn more, visit [employer].be. Exact amounts may vary. You may need different amounts. This information is not intended to replace advice given to you by your health care provider. Make sure you discuss any questions you have with your health care provider. Document Revised: 01/24/2022 Document Reviewed: 01/24/2022 Elsevier Patient Education  2024 [employer].

## 2024-04-03 ENCOUNTER — Other Ambulatory Visit (HOSPITAL_COMMUNITY)
Admission: RE | Admit: 2024-04-03 | Discharge: 2024-04-03 | Disposition: A | Source: Ambulatory Visit | Attending: Nurse Practitioner | Admitting: Nurse Practitioner

## 2024-04-03 ENCOUNTER — Ambulatory Visit (INDEPENDENT_AMBULATORY_CARE_PROVIDER_SITE_OTHER): Payer: Self-pay | Admitting: Nurse Practitioner

## 2024-04-03 ENCOUNTER — Encounter: Payer: Self-pay | Admitting: Nurse Practitioner

## 2024-04-03 VITALS — BP 136/78 | HR 80 | Temp 97.9°F | Resp 14 | Ht 65.98 in | Wt 198.0 lb

## 2024-04-03 DIAGNOSIS — R748 Abnormal levels of other serum enzymes: Secondary | ICD-10-CM | POA: Diagnosis not present

## 2024-04-03 DIAGNOSIS — Z23 Encounter for immunization: Secondary | ICD-10-CM

## 2024-04-03 DIAGNOSIS — E7800 Pure hypercholesterolemia, unspecified: Secondary | ICD-10-CM | POA: Diagnosis not present

## 2024-04-03 DIAGNOSIS — Z1231 Encounter for screening mammogram for malignant neoplasm of breast: Secondary | ICD-10-CM

## 2024-04-03 DIAGNOSIS — E559 Vitamin D deficiency, unspecified: Secondary | ICD-10-CM

## 2024-04-03 DIAGNOSIS — Z6830 Body mass index (BMI) 30.0-30.9, adult: Secondary | ICD-10-CM

## 2024-04-03 DIAGNOSIS — I10 Essential (primary) hypertension: Secondary | ICD-10-CM

## 2024-04-03 DIAGNOSIS — Z0000 Encounter for general adult medical examination without abnormal findings: Secondary | ICD-10-CM

## 2024-04-03 DIAGNOSIS — Z124 Encounter for screening for malignant neoplasm of cervix: Secondary | ICD-10-CM

## 2024-04-03 DIAGNOSIS — E6609 Other obesity due to excess calories: Secondary | ICD-10-CM

## 2024-04-03 DIAGNOSIS — Z1211 Encounter for screening for malignant neoplasm of colon: Secondary | ICD-10-CM

## 2024-04-03 DIAGNOSIS — E66811 Obesity, class 1: Secondary | ICD-10-CM

## 2024-04-03 MED ORDER — VALACYCLOVIR HCL 1 G PO TABS: 1000.0000 mg | ORAL_TABLET | Freq: Every day | ORAL | 4 refills | Status: AC

## 2024-04-03 NOTE — Assessment & Plan Note (Addendum)
 Ongoing, normal GGT and no RUQ symptoms.  Will continue to monitor.  Educated patient on findings.  Labs today: CMP, GGT.

## 2024-04-03 NOTE — Progress Notes (Signed)
 BP 136/78 (BP Location: Left Arm, Patient Position: Sitting)   Pulse 80   Temp 97.9 F (36.6 C) (Oral)   Resp 14   Ht 5' 5.98 (1.676 m)   Wt 198 lb (89.8 kg)   LMP  (LMP Unknown)   SpO2 98%   BMI 31.97 kg/m    Subjective:    Patient ID: Maria Ball, female    DOB: [birth year], 63 y.o.   MRN: [id number]  HPI: Maria Ball is a 63 y.o. female presenting on 04/03/2024 for comprehensive medical examination. Current medical complaints include:none  She currently lives with: self Menopausal Symptoms: no  History of elevation in Alkaline phosphatase.  Continues on Vitamin D  supplement daily.   The 10-year ASCVD risk score (Arnett DK, et al., 2019) is: 6.6%   Values used to calculate the score:     Age: 49 years     Clincally relevant sex: Female     Is Non-Hispanic African American: No     Diabetic: No     Tobacco smoker: No     Systolic Blood Pressure: 136 mmHg     Is BP treated: No     HDL Cholesterol: 44 mg/dL     Total Cholesterol: 218 mg/dL   Functional Status Survey: Is the patient deaf or have difficulty hearing?: No Does the patient have difficulty seeing, even when wearing glasses/contacts?: No Does the patient have difficulty concentrating, remembering, or making decisions?: No Does the patient have difficulty walking or climbing stairs?: No Does the patient have difficulty dressing or bathing?: No Does the patient have difficulty doing errands alone such as visiting a doctor's office or shopping?: No   Depression Screen done today and results listed below:     04/03/2024    8:09 AM 01/30/2024   11:26 AM 04/03/2023    8:14 AM 03/30/2022    8:30 AM 03/29/2021    2:29 PM  Depression screen PHQ 2/9  Decreased Interest 0 0 0 0 0  Down, Depressed, Hopeless 0 0 0 0 0  PHQ - 2 Score 0 0 0 0 0  Altered sleeping 0 0 0 0 0  Tired, decreased energy 0 0 0 0 0  Change in appetite 0 0 0 0 0  Feeling bad or failure about yourself  0 0 0 0 0  Trouble  concentrating 0 0 0 0 0  Moving slowly or fidgety/restless 0 0 0 0 0  Suicidal thoughts 0 0 0 0 0  PHQ-9 Score 0 0  0  0  0   Difficult doing work/chores   Not difficult at all Not difficult at all Not difficult at all     Data saved with a previous flowsheet row definition      04/03/2024    8:09 AM 01/30/2024   11:25 AM 04/03/2023    8:14 AM 03/30/2022    8:30 AM  GAD 7 : Generalized Anxiety Score  Nervous, Anxious, on Edge 0 0 0 0  Control/stop worrying 0 0 0 0  Worry too much - different things 0 0 0 0  Trouble relaxing 0 0 0 0  Restless 0 0 0 0  Easily annoyed or irritable 0 0 0 0  Afraid - awful might happen 0 0 0 0  Total GAD 7 Score 0 0 0 0  Anxiety Difficulty   Not difficult at all Not difficult at all      03/29/2021    2:29 PM 03/30/2022  8:30 AM 03/30/2022    8:31 AM 04/03/2023    8:13 AM 04/03/2024    8:09 AM  Fall Risk  Falls in the past year? 0 1 1 0 0  Was there an injury with Fall? 0 1 0 0 0  Fall Risk Category Calculator 0 2 1 0 0  Fall Risk Category (Retired) Low  Moderate  Low     (RETIRED) Patient Fall Risk Level Low fall risk   Low fall risk     Patient at Risk for Falls Due to No Fall Risks History of fall(s) History of fall(s) No Fall Risks No Fall Risks  Fall risk Follow up Falls evaluation completed  Falls evaluation completed  Falls prevention discussed  Falls evaluation completed Falls evaluation completed     Data saved with a previous flowsheet row definition    Past Medical History:  Past Medical History:  Diagnosis Date   Alkaline phosphatase elevation    Basal cell carcinoma 02/17/2021   L of midline sup forehead - ED&C   Depression    Depression 12/11/2014   DVT (deep venous thrombosis) (HCC)    while taking OCP in the 1980's   Hypertension    Shingles    Shingles     Surgical History:  Past Surgical History:  Procedure Laterality Date   COLONOSCOPY WITH PROPOFOL  N/A 04/16/2018   Procedure: COLONOSCOPY WITH PROPOFOL ;   Surgeon: Therisa Bi, MD;  Location: [facility] ENDOSCOPY;  Service: Gastroenterology;  Laterality: N/A;   COLONOSCOPY WITH PROPOFOL  N/A 05/24/2021   Procedure: COLONOSCOPY WITH PROPOFOL ;  Surgeon: Unk Corinn Skiff, MD;  Location: [facility] ENDOSCOPY;  Service: Gastroenterology;  Laterality: N/A;   DILATION AND CURETTAGE OF UTERUS     TONSILLECTOMY      Medications:  Current Outpatient Medications on File Prior to Visit  Medication Sig   Cholecalciferol (VITAMIN D3) 25 MCG (1000 UT) CAPS Take 2 capsules by mouth daily.   traMADol  (ULTRAM ) 50 MG tablet Take 1 tablet (50 mg total) by mouth every 12 (twelve) hours as needed.   Current Facility-Administered Medications on File Prior to Visit  Medication   triamcinolone  acetonide (KENALOG -40) injection 40 mg    Allergies:  No Known Allergies  Social History:  Social History   Socioeconomic History   Marital status: Divorced    Spouse name: Not on file   Number of children: Not on file   Years of education: Not on file   Highest education level: Not on file  Occupational History   Not on file  Tobacco Use   Smoking status: Never   Smokeless tobacco: Never  Vaping Use   Vaping status: Never Used  Substance and Sexual Activity   Alcohol use: No    Alcohol/week: 0.0 standard drinks of alcohol   Drug use: No   Sexual activity: Never  Other Topics Concern   Not on file  Social History Narrative   Not on file   Social Drivers of Health   Financial Resource Strain: Not on file  Food Insecurity: Not on file  Transportation Needs: Not on file  Physical Activity: Not on file  Stress: Not on file  Social Connections: Not on file  Intimate Partner Violence: Not on file   Social History   Tobacco Use  Smoking Status Never  Smokeless Tobacco Never   Social History   Substance and Sexual Activity  Alcohol Use No   Alcohol/week: 0.0 standard drinks of alcohol    Family History:  Family History  Problem Relation Age of Onset    Cancer Mother        colon-rectal   Emphysema Father    Cancer Sister        breast   Hypertension Sister    Diabetes Sister    Breast cancer Sister 45   Stroke Sister    Hypertension Brother     Past medical history, surgical history, medications, allergies, family history and social history reviewed with patient today and changes made to appropriate areas of the chart.   ROS All other ROS negative except what is listed above and in the HPI.      Objective:    BP 136/78 (BP Location: Left Arm, Patient Position: Sitting)   Pulse 80   Temp 97.9 F (36.6 C) (Oral)   Resp 14   Ht 5' 5.98 (1.676 m)   Wt 198 lb (89.8 kg)   LMP  (LMP Unknown)   SpO2 98%   BMI 31.97 kg/m   Wt Readings from Last 3 Encounters:  04/03/24 198 lb (89.8 kg)  02/15/24 195 lb 3.2 oz (88.5 kg)  01/30/24 194 lb 9.6 oz (88.3 kg)    Physical Exam Vitals and nursing note reviewed. Exam conducted with a chaperone present.  Constitutional:      General: She is awake. She is not in acute distress.    Appearance: She is well-developed and well-groomed. She is not ill-appearing or toxic-appearing.  HENT:     Head: Normocephalic and atraumatic.     Right Ear: Hearing, tympanic membrane, ear canal and external ear normal. No drainage.     Left Ear: Hearing, tympanic membrane, ear canal and external ear normal. No drainage.     Nose: Nose normal.     Right Sinus: No maxillary sinus tenderness or frontal sinus tenderness.     Left Sinus: No maxillary sinus tenderness or frontal sinus tenderness.     Mouth/Throat:     Mouth: Mucous membranes are moist.     Pharynx: Oropharynx is clear. Uvula midline. No pharyngeal swelling, oropharyngeal exudate or posterior oropharyngeal erythema.  Eyes:     General: Lids are normal.        Right eye: No discharge.        Left eye: No discharge.     Extraocular Movements: Extraocular movements intact.     Conjunctiva/sclera: Conjunctivae normal.     Pupils: Pupils are  equal, round, and reactive to light.     Visual Fields: Right eye visual fields normal and left eye visual fields normal.  Neck:     Thyroid: No thyromegaly.     Vascular: No carotid bruit.     Trachea: Trachea normal.  Cardiovascular:     Rate and Rhythm: Normal rate and regular rhythm.     Heart sounds: Normal heart sounds. No murmur heard.    No gallop.  Pulmonary:     Effort: Pulmonary effort is normal. No accessory muscle usage or respiratory distress.     Breath sounds: Normal breath sounds.  Chest:  Breasts:    Right: Normal.     Left: Normal.  Abdominal:     General: Bowel sounds are normal.     Palpations: Abdomen is soft. There is no hepatomegaly or splenomegaly.     Tenderness: There is no abdominal tenderness.     Hernia: There is no hernia in the left inguinal area or right inguinal area.  Genitourinary:    Exam position: Lithotomy position.  Labia:        Right: No rash.        Left: No rash.      Urethra: No prolapse.     Vagina: Normal.     Cervix: Normal.     Uterus: Normal.      Adnexa: Right adnexa normal and left adnexa normal.     Comments: Pap obtained and sent to lab. Musculoskeletal:        General: Normal range of motion.     Cervical back: Normal range of motion and neck supple.     Right lower leg: No edema.     Left lower leg: No edema.  Lymphadenopathy:     Head:     Right side of head: No submental, submandibular, tonsillar, preauricular or posterior auricular adenopathy.     Left side of head: No submental, submandibular, tonsillar, preauricular or posterior auricular adenopathy.     Cervical: No cervical adenopathy.     Upper Body:     Right upper body: No supraclavicular, axillary or pectoral adenopathy.     Left upper body: No supraclavicular, axillary or pectoral adenopathy.  Skin:    General: Skin is warm and dry.     Capillary Refill: Capillary refill takes less than 2 seconds.     Findings: No rash.  Neurological:     Mental  Status: She is alert and oriented to person, place, and time.     Gait: Gait is intact.     Deep Tendon Reflexes: Reflexes are normal and symmetric.     Reflex Scores:      Brachioradialis reflexes are 2+ on the right side and 2+ on the left side.      Patellar reflexes are 2+ on the right side and 2+ on the left side. Psychiatric:        Attention and Perception: Attention normal.        Mood and Affect: Mood normal.        Speech: Speech normal.        Behavior: Behavior normal. Behavior is cooperative.        Thought Content: Thought content normal.        Judgment: Judgment normal.    Results for orders placed or performed in visit on 01/30/24  POC Covid19/Flu A&B Antigen   Collection Time: 01/30/24 11:43 AM  Result Value Ref Range   Influenza A Antigen, POC Negative Negative   Influenza B Antigen, POC Negative Negative   Covid Antigen, POC Negative Negative  Rapid Strep screen([facility])   Collection Time: 01/30/24 11:46 AM   Specimen: Other   Other  Result Value Ref Range   Strep Gp A Ag, IA W/Reflex Negative Negative  Culture, Group A Strep   Collection Time: 01/30/24 11:46 AM   Other  Result Value Ref Range   Strep A Culture Negative       Assessment & Plan:   Problem List Items Addressed This Visit       Cardiovascular and Mediastinum   Hypertension - Primary   Ongoing, stable. Has made diet changes over past years with benefit.  BP close to goal today.  No medications at this time.  Continue to monitor and initiate as needed.  Labs today: CBC, CMP, TSH.      Relevant Orders   CBC with Differential/Platelet   Comprehensive metabolic panel with GFR   TSH     Other   Vitamin D  deficiency   Chronic, stable, continue supplement  daily and adjust as needed.  Check level today.      Relevant Orders   VITAMIN D  25 Hydroxy (Vit-D Deficiency, Fractures)   Obesity   BMI 31.97.  Recommended eating smaller high protein, low fat meals more frequently and  exercising 30 mins a day 5 times a week with a goal of 10-15lb weight loss in the next 3 months. Patient voiced their understanding and motivation to adhere to these recommendations.       Elevated low density lipoprotein (LDL) cholesterol level   Ongoing. Right carotid calcifications noted on dental imaging, but per patient the dentist told her no change from 2018.  Continue without medication and focus on diet and exercise.  Lipid panel today.  Discussed possible doppler ultrasound carotids or CT calcium scoring -- currently she has no symptoms and wishes to look into these. The 10-year ASCVD risk score (Arnett DK, et al., 2019) is: 6.6%   Values used to calculate the score:     Age: 69 years     Clincally relevant sex: Female     Is Non-Hispanic African American: No     Diabetic: No     Tobacco smoker: No     Systolic Blood Pressure: 136 mmHg     Is BP treated: No     HDL Cholesterol: 44 mg/dL     Total Cholesterol: 218 mg/dL       Relevant Orders   Comprehensive metabolic panel with GFR   Lipid Panel w/o Chol/HDL Ratio   Elevated alkaline phosphatase level   Ongoing, normal GGT and no RUQ symptoms.  Will continue to monitor.  Educated patient on findings.  Labs today: CMP, GGT.      Relevant Orders   Comprehensive metabolic panel with GFR   Gamma GT   Other Visit Diagnoses       Cervical cancer screening       Pap performed and sent to lab today.   Relevant Orders   Cytology - PAP     Flu vaccine need       Flu vaccine today, educated patient.   Relevant Orders   Flu vaccine trivalent PF, 6mos and older(Flulaval,Afluria,Fluarix,Fluzone) (Completed)     Pneumococcal vaccination given       PCV20 today, educated patient.   Relevant Orders   Pneumococcal conjugate vaccine 20-valent (Completed)     Encounter for screening mammogram for malignant neoplasm of breast       Mammogram ordered, instructed how to schedule.   Relevant Orders   MM 3D SCREENING MAMMOGRAM  BILATERAL BREAST     Encounter for annual physical exam       Annual physical today with labs and health maintenance reviewed, discussed with patient.        Follow up plan: Return in about 1 year (around 04/03/2025) for Annual Physical.   LABORATORY TESTING:  - Pap smear: up to date  IMMUNIZATIONS:   - Tdap: Tetanus vaccination status reviewed: Td vaccination indicated and given today. - Influenza: Up To Date - Pneumovax: Not applicable - Prevnar: Up To Date - COVID: Up to date - HPV: Not applicable - Shingrix  vaccine: Up to date  SCREENING: -Mammogram: Ordered today due 05/03/24 - Colonoscopy: Up to date  - Bone Density: Not applicable  -Hearing Test: Not applicable  -Spirometry: Not applicable   PATIENT COUNSELING:   Advised to take 1 mg of folate supplement per day if capable of pregnancy.   Sexuality: Discussed sexually transmitted diseases, partner selection,  use of condoms, avoidance of unintended pregnancy  and contraceptive alternatives.   Advised to avoid cigarette smoking.  I discussed with the patient that most people either abstain from alcohol or drink within safe limits (<=14/week and <=4 drinks/occasion for males, <=7/weeks and <= 3 drinks/occasion for females) and that the risk for alcohol disorders and other health effects rises proportionally with the number of drinks per week and how often a drinker exceeds daily limits.  Discussed cessation/primary prevention of drug use and availability of treatment for abuse.   Diet: Encouraged to adjust caloric intake to maintain  or achieve ideal body weight, to reduce intake of dietary saturated fat and total fat, to limit sodium intake by avoiding high sodium foods and not adding table salt, and to maintain adequate dietary potassium and calcium preferably from fresh fruits, vegetables, and low-fat dairy products.    Stressed the importance of regular exercise  Injury prevention: Discussed safety belts, safety  helmets, smoke detector, smoking near bedding or upholstery.   Dental health: Discussed importance of regular tooth brushing, flossing, and dental visits.    NEXT PREVENTATIVE PHYSICAL DUE IN 1 YEAR. Return in about 1 year (around 04/03/2025) for Annual Physical.

## 2024-04-03 NOTE — Assessment & Plan Note (Signed)
 Ongoing. Right carotid calcifications noted on dental imaging, but per patient the dentist told her no change from 2018.  Continue without medication and focus on diet and exercise.  Lipid panel today.  Discussed possible doppler ultrasound carotids or CT calcium scoring -- currently she has no symptoms and wishes to look into these. The 10-year ASCVD risk score (Arnett DK, et al., 2019) is: 6.6%   Values used to calculate the score:     Age: 63 years     Clincally relevant sex: Female     Is Non-Hispanic African American: No     Diabetic: No     Tobacco smoker: No     Systolic Blood Pressure: 136 mmHg     Is BP treated: No     HDL Cholesterol: 44 mg/dL     Total Cholesterol: 218 mg/dL

## 2024-04-03 NOTE — Assessment & Plan Note (Signed)
 Chronic, stable, continue supplement daily and adjust as needed.  Check level today.

## 2024-04-03 NOTE — Assessment & Plan Note (Signed)
BMI 31.97.  Recommended eating smaller high protein, low fat meals more frequently and exercising 30 mins a [place] 5 times a week with a goal of 10-15lb weight loss in the next 3 months. Patient voiced their understanding and motivation to adhere to these recommendations.

## 2024-04-03 NOTE — Assessment & Plan Note (Addendum)
 Ongoing, stable. Has made diet changes over past years with benefit.  BP close to goal today.  No medications at this time.  Continue to monitor and initiate as needed.  Labs today: CBC, CMP, TSH.

## 2024-04-04 LAB — CBC WITH DIFFERENTIAL/PLATELET
Basophils Absolute: 0.1 x10E3/uL (ref 0.0–0.2)
Basos: 1 %
EOS (ABSOLUTE): 0.1 x10E3/uL (ref 0.0–0.4)
Eos: 2 %
Hematocrit: 43.5 % (ref 34.0–46.6)
Hemoglobin: 13.9 g/dL (ref 11.1–15.9)
Immature Grans (Abs): 0 x10E3/uL (ref 0.0–0.1)
Immature Granulocytes: 0 %
Lymphocytes Absolute: 2.4 x10E3/uL (ref 0.7–3.1)
Lymphs: 33 %
MCH: 27.2 pg (ref 26.6–33.0)
MCHC: 32 g/dL (ref 31.5–35.7)
MCV: 85 fL (ref 79–97)
Monocytes Absolute: 0.5 x10E3/uL (ref 0.1–0.9)
Monocytes: 7 %
Neutrophils Absolute: 4.3 x10E3/uL (ref 1.4–7.0)
Neutrophils: 57 %
Platelets: 418 x10E3/uL (ref 150–450)
RBC: 5.11 x10E6/uL (ref 3.77–5.28)
RDW: 13.7 % (ref 11.7–15.4)
WBC: 7.4 x10E3/uL (ref 3.4–10.8)

## 2024-04-04 LAB — GAMMA GT: GGT: 19 IU/L (ref 0–60)

## 2024-04-04 LAB — COMPREHENSIVE METABOLIC PANEL WITH GFR
ALT: 22 IU/L (ref 0–32)
AST: 18 IU/L (ref 0–40)
Albumin: 4.7 g/dL (ref 3.9–4.9)
Alkaline Phosphatase: 193 IU/L — ABNORMAL HIGH (ref 49–135)
BUN/Creatinine Ratio: 12 (ref 12–28)
BUN: 9 mg/dL (ref 8–27)
Bilirubin Total: 0.2 mg/dL (ref 0.0–1.2)
CO2: 23 mmol/L (ref 20–29)
Calcium: 9.8 mg/dL (ref 8.7–10.3)
Chloride: 104 mmol/L (ref 96–106)
Creatinine, Ser: 0.78 mg/dL (ref 0.57–1.00)
Globulin, Total: 2.3 g/dL (ref 1.5–4.5)
Glucose: 95 mg/dL (ref 70–99)
Potassium: 4.3 mmol/L (ref 3.5–5.2)
Sodium: 141 mmol/L (ref 134–144)
Total Protein: 7 g/dL (ref 6.0–8.5)
eGFR: 85 mL/min/1.73 (ref >59)

## 2024-04-04 LAB — LIPID PANEL W/O CHOL/HDL RATIO
Cholesterol, Total: 210 mg/dL — ABNORMAL HIGH (ref 100–199)
HDL: 39 mg/dL — ABNORMAL LOW (ref >39)
LDL Chol Calc (NIH): 132 mg/dL — ABNORMAL HIGH (ref 0–99)
Triglycerides: 219 mg/dL — ABNORMAL HIGH (ref 0–149)
VLDL Cholesterol Cal: 39 mg/dL (ref 5–40)

## 2024-04-04 LAB — TSH: TSH: 1.41 u[IU]/mL (ref 0.450–4.500)

## 2024-04-04 LAB — VITAMIN D 25 HYDROXY (VIT D DEFICIENCY, FRACTURES): Vit D, 25-Hydroxy: 25.1 ng/mL — ABNORMAL LOW (ref 30.0–100.0)

## 2024-04-05 ENCOUNTER — Ambulatory Visit: Payer: Self-pay | Admitting: Nurse Practitioner

## 2024-04-05 NOTE — Progress Notes (Signed)
 Contacted via MyChart The 10-year ASCVD risk score (Arnett DK, et al., 2019) is: 6.8%   Values used to calculate the score:     Age: 63 years     Clincally relevant sex: Female     Is Non-Hispanic African American: No     Diabetic: No     Tobacco smoker: No     Systolic Blood Pressure: 136 mmHg     Is BP treated: No     HDL Cholesterol: 39 mg/dL     Total Cholesterol: 210 mg/dL  Good morning Maria Ball, your labs have returned: - Alkaline phosphatase level remains a bit elevated, but liver function is normal. We will continue to monitor this at visits. - Lipid panel continues to show elevations, but no medications needed at this time. Focus heavily on healthy diet changes and regular exercise. - Vitamin D  level is a little low, ensure you are taking Vitamin D3 2000 units daily for overall bone health. - Remainder of labs stable. Any questions? Keep being stellar!!  Thank you for allowing me to participate in your care.  I appreciate you. Kindest regards, Rishaan Gunner

## 2024-04-08 LAB — CYTOLOGY - PAP
Comment: NEGATIVE
Diagnosis: NEGATIVE
High risk HPV: NEGATIVE

## 2024-04-08 NOTE — Progress Notes (Signed)
 Contacted via MyChart  Pap is negative. Great news!!  No further pap testing needed.

## 2024-05-07 ENCOUNTER — Ambulatory Visit
Admission: RE | Admit: 2024-05-07 | Discharge: 2024-05-07 | Disposition: A | Discharge status: Home / Self Care | Source: Ambulatory Visit | Attending: Nurse Practitioner | Admitting: Nurse Practitioner

## 2024-05-07 DIAGNOSIS — Z1231 Encounter for screening mammogram for malignant neoplasm of breast: Secondary | ICD-10-CM | POA: Diagnosis present

## 2024-05-08 ENCOUNTER — Ambulatory Visit: Admitting: Sports Medicine

## 2024-05-08 ENCOUNTER — Encounter: Payer: Self-pay | Admitting: Sports Medicine

## 2024-05-08 ENCOUNTER — Other Ambulatory Visit: Payer: Self-pay

## 2024-05-08 DIAGNOSIS — G5702 Lesion of sciatic nerve, left lower limb: Secondary | ICD-10-CM | POA: Diagnosis not present

## 2024-05-08 DIAGNOSIS — M25552 Pain in left hip: Secondary | ICD-10-CM | POA: Diagnosis not present

## 2024-05-08 DIAGNOSIS — M94352 Chondrolysis, left hip: Secondary | ICD-10-CM

## 2024-05-08 DIAGNOSIS — M160 Bilateral primary osteoarthritis of hip: Secondary | ICD-10-CM | POA: Diagnosis not present

## 2024-05-08 DIAGNOSIS — G8929 Other chronic pain: Secondary | ICD-10-CM

## 2024-05-08 MED ORDER — LIDOCAINE HCL 1 % IJ SOLN
4.0000 mL | INTRAMUSCULAR | Status: AC | PRN
  Administered 2024-05-08: 4 mL

## 2024-05-08 MED ORDER — METHYLPREDNISOLONE ACETATE 40 MG/ML IJ SUSP
80.0000 mg | INTRAMUSCULAR | Status: AC | PRN
  Administered 2024-05-08: 80 mg via INTRA_ARTICULAR

## 2024-05-08 NOTE — Progress Notes (Signed)
 "  Maria Ball - 63 y.o. female MRN [id number]  Date of birth: 10/12/1960  Office Visit Note: Visit Date: 05/08/2024 PCP: Valerio Melanie DASEN, NP Referred by: Valerio Melanie DASEN, NP  Subjective: Chief Complaint  Patient presents with   Left Hip - Follow-up   HPI: Maria Ball is a pleasant 63 y.o. female who presents today for follow-up of chronic left hip pain.  Orie has had a return of her left hip pain.  Last had an injection back in September which did very well until the first week of December where her pain returned. She says that it does feel the same as it felt prior to the injection in September. She is having more pain through the groin and the thigh, as well, and would like to discuss her thigh pain as it has gotten worse. She has pain when going from seated to standing, and that pain does improve as she takes a few steps. She has been to her initial session of physical therapy, and does have appointments scheduled twice weekly for 4 weeks. They have begun working through stretches and mobility.  She is somewhat frustrated with her ongoing pain as this has been over a year and does cause her discomfort.  She has been relying on over-the-counter anti-inflammatories since her pain has returned.  Also using 1/2 tablet of tramadol  50 mg for breakthrough pain only.  Previous treatment: - Sciatic nerve hydrodissection 01/16/2024 - Intra-articular hip injection 02/06/2024  Pertinent ROS were reviewed with the patient and found to be negative unless otherwise specified above in HPI.   Assessment & Plan: Visit Diagnoses:  1. Chondrolysis of left hip   2. Bilateral primary osteoarthritis of hip   3. Chronic left hip pain   4. Piriformis syndrome of left side    Plan: Impression is acute exacerbation of chronic left hip pain with advanced left > right hip osteoarthritis and chondrolysis.  MRI shows high-grade cartilage loss/softening as well as subchondral cystic change within  the femoral head.  She did receive very good relief from intra-articular hip injection back in September but this only lasted about 2 months before her pain returned.  She has been having pain which is affecting her ADLs and quality of life for over the last 1 year.  She also has a degree of piriformis or dead butt syndrome which is certainly contributing, although I believe this is secondary to her underlying hip arthritis and cartilage loss.  We had a lengthy discussion today regarding conservative and surgical treatment options, ultimately I do believe she would benefit greatly from a total hip arthroplasty.  We discussed the general overview of the surgery that would be performed by one of my orthopedic colleagues, Dr. Vernetta.  She is agreeable to referral to discuss this further.  In the short-term, she is not planning on having this done over the next few months so we did proceed with ultrasound-guided intra-articular hip injection, patient tolerated well.  She may continue using over-the-counter anti-inflammatories, ice/heat.  She may use her 1/2 tablet of tramadol  50 mg for breakthrough pain only.  She will see Dr. Vernetta and I am happy to see her back or answer further questions after this appointment.  She will continue working on her physical therapy for her piriformis syndrome and support of the surrounding anterior and posterior hip musculature.  Follow-up: Return for Make appt with Dr. Vernetta for L-hip THA discussion in 4-6 weeks.   Meds & Orders: No orders  of the defined types were placed in this encounter.   Orders Placed This Encounter  Procedures   Large Joint Inj: L hip joint   US  Guided Needle Placement - No Linked Charges     Procedures: Large Joint Inj: L hip joint on 05/08/2024 9:32 AM Indications: pain Details: 22 G 3.5 in needle, ultrasound-guided anterior approach Medications: 4 mL lidocaine  1 %; 80 mg methylPREDNISolone  acetate 40 MG/ML Outcome: tolerated well, no  immediate complications  Procedure: US -guided intra-articular hip injection, Left After discussion on risks/benefits/indications and informed verbal consent was obtained, a timeout was performed. Patient was lying supine on exam table. The hip was cleaned with betadine and alcohol swabs. Then utilizing ultrasound guidance, the patient's femoral head and neck junction was identified and subsequently injected with 4:2 lidocaine :depomedrol via an in-plane approach with ultrasound visualization of the injectate administered into the hip joint. Patient tolerated procedure well without immediate complications.  Procedure, treatment alternatives, risks and benefits explained, specific risks discussed. Consent was given by the patient. Immediately prior to procedure a time out was called to verify the correct patient, procedure, equipment, support staff and site/side marked as required. Patient was prepped and draped in the usual sterile fashion.          Clinical History: No specialty comments available.  She reports that she has never smoked. She has never used smokeless tobacco. No results for input(s): HGBA1C, LABURIC in the last 8760 hours.  Objective:   Vital Signs: LMP  (LMP Unknown)   Physical Exam  Gen: Well-appearing, in no acute distress; non-toxic CV: Well-perfused. Warm.  Resp: Breathing unlabored on room air; no wheezing. Psych: Fluid speech in conversation; appropriate affect; normal thought process  Ortho Exam - Left hip: Loss of IR > ER. + Pain with transitioning seating --> standing. + FADIR and Stinchfield testing.  Imaging:  Narrative & Impression  EXAM DESCRIPTION: MR HIP LEFT WO CONTRAST   CLINICAL HISTORY: Hip pain, chronic, articular cartilage eval, xray done; Hip pain, chronic, labral tear suspected, xray done   COMPARISON: None Available.   TECHNIQUE: MRI of the hip is performed according to our usual protocol with multiplanar multi sequence imaging.    FINDINGS: No fracture or dislocation. Moderate degenerative change to the bilateral hips greater on the left with diffuse chondrolysis and joint space narrowing. Subchondral cysts to the femoral heads bilaterally measuring up to 1 cm on the left. Also bilateral femoral head osteophytes. Mild degenerative edema. There is a 13 mm subchondral cyst in the posterior acetabulum on the left. Mild degenerative change to the sacroiliac joints. The marrow signal is otherwise unremarkable.   No active linear labral tear. No significant joint effusion or bursal fluid. The visualized tendons and musculature are unremarkable. No subcutaneous edema.     IMPRESSION: Moderate degenerative change bilateral hips greater on the left.   Mild degenerative change to the sacroiliac joints.   Electronically signed by: Reyes Frees MD 11/21/2023 03:00 PM EDT RP Workstation: [id number]    Past Medical/Family/Surgical/Social History: Medications & Allergies reviewed per EMR, new medications updated. Patient Active Problem List   Diagnosis Date Noted   H/O adenomatous polyp of colon    Vitamin D  deficiency 03/29/2021   Elevated low density lipoprotein (LDL) cholesterol level 03/28/2021   Basal cell carcinoma 02/17/2021   DDD (degenerative disc disease), lumbar 02/10/2019   Obesity 12/12/2014   Hypertension 12/11/2014   Elevated alkaline phosphatase level 12/11/2014   Past Medical History:  Diagnosis Date   Alkaline phosphatase  elevation    Basal cell carcinoma 02/17/2021   L of midline sup forehead - ED&C   Depression    Depression 12/11/2014   DVT (deep venous thrombosis) (HCC)    while taking OCP in the 1980's   Hypertension    Shingles    Shingles    Family History  Problem Relation Age of Onset   Cancer Mother        colon-rectal   Emphysema Father    Cancer Sister        breast   Hypertension Sister    Diabetes Sister    Breast cancer Sister 14   Stroke Sister    Hypertension  Brother    Past Surgical History:  Procedure Laterality Date   COLONOSCOPY WITH PROPOFOL  N/A 04/16/2018   Procedure: COLONOSCOPY WITH PROPOFOL ;  Surgeon: Therisa Bi, MD;  Location: [facility] ENDOSCOPY;  Service: Gastroenterology;  Laterality: N/A;   COLONOSCOPY WITH PROPOFOL  N/A 05/24/2021   Procedure: COLONOSCOPY WITH PROPOFOL ;  Surgeon: Unk Corinn Skiff, MD;  Location: [facility] ENDOSCOPY;  Service: Gastroenterology;  Laterality: N/A;   DILATION AND CURETTAGE OF UTERUS     TONSILLECTOMY     Social History   Occupational History   Not on file  Tobacco Use   Smoking status: Never   Smokeless tobacco: Never  Vaping Use   Vaping status: Never Used  Substance and Sexual Activity   Alcohol use: No    Alcohol/week: 0.0 standard drinks of alcohol   Drug use: No   Sexual activity: Never   "

## 2024-05-08 NOTE — Progress Notes (Signed)
 Patient says that her pain has returned as of the first of December. She says that it does feel the same as it felt prior to the injection in September. She is having more pain through the groin and the thigh, as well, and would like to discuss her thigh pain as it has gotten worse. She has pain when going from seated to standing, and that pain does improve as she takes a few steps. She has been to her initial session of physical therapy, and does have appointments scheduled twice weekly for 4 weeks. They have begun working through stretches and mobility.

## 2024-05-10 NOTE — Progress Notes (Signed)
 Contacted via MyChart   Normal mammogram, may repeat in one year:)

## 2024-06-19 ENCOUNTER — Encounter: Admitting: Orthopaedic Surgery

## 2024-07-03 ENCOUNTER — Encounter: Admitting: Orthopaedic Surgery

## 2024-08-29 ENCOUNTER — Ambulatory Visit: Admitting: Dermatology

## 2025-04-08 ENCOUNTER — Encounter: Admitting: Nurse Practitioner
